# Patient Record
Sex: Male | Born: 1950 | Race: White | Hispanic: No | Marital: Married | Smoking: Former smoker
Health system: Southern US, Community
[De-identification: ages and names within clinical notes are randomized; demographics above are authoritative.]

## PROBLEM LIST (undated history)

## (undated) DIAGNOSIS — K219 Gastro-esophageal reflux disease without esophagitis: Secondary | ICD-10-CM

## (undated) DIAGNOSIS — M1990 Unspecified osteoarthritis, unspecified site: Secondary | ICD-10-CM

## (undated) DIAGNOSIS — I639 Cerebral infarction, unspecified: Secondary | ICD-10-CM

## (undated) DIAGNOSIS — I4891 Unspecified atrial fibrillation: Secondary | ICD-10-CM

## (undated) HISTORY — DX: Gastro-esophageal reflux disease without esophagitis: K21.9

## (undated) HISTORY — PX: ESOPHAGOGASTRODUODENOSCOPY: SHX1529

## (undated) HISTORY — PX: OTHER SURGICAL HISTORY: SHX169

## (undated) HISTORY — PX: COLONOSCOPY: SHX174

---

## 2015-08-17 ENCOUNTER — Encounter: Payer: Self-pay | Admitting: Internal Medicine

## 2015-09-03 ENCOUNTER — Ambulatory Visit (INDEPENDENT_AMBULATORY_CARE_PROVIDER_SITE_OTHER): Payer: BLUE CROSS/BLUE SHIELD | Admitting: Gastroenterology

## 2015-09-03 ENCOUNTER — Encounter (INDEPENDENT_AMBULATORY_CARE_PROVIDER_SITE_OTHER): Payer: Self-pay

## 2015-09-03 ENCOUNTER — Encounter: Payer: Self-pay | Admitting: Gastroenterology

## 2015-09-03 VITALS — BP 157/61 | HR 49 | Temp 97.8°F | Ht 74.0 in | Wt 293.2 lb

## 2015-09-03 DIAGNOSIS — D649 Anemia, unspecified: Secondary | ICD-10-CM | POA: Diagnosis not present

## 2015-09-03 DIAGNOSIS — K2270 Barrett's esophagus without dysplasia: Secondary | ICD-10-CM | POA: Insufficient documentation

## 2015-09-03 DIAGNOSIS — K219 Gastro-esophageal reflux disease without esophagitis: Secondary | ICD-10-CM

## 2015-09-03 DIAGNOSIS — R195 Other fecal abnormalities: Secondary | ICD-10-CM

## 2015-09-03 NOTE — Progress Notes (Signed)
Primary Care Physician:  Maximiano Coss, MD  Primary Gastroenterologist:  Roetta Sessions, MD   Chief Complaint  Patient presents with  . heme + stool    HPI:  Miguel Alvarez is a 65 y.o. male here at the request of Dr. hunger length for further evaluation of Hemoccult-positive stool, abnormal labs. Labs on 07/26/2015 showed hemoglobin of 12.3, hematocrit 37.5, MCV 88.7, platelets 192,000, white blood cell count 7000. He was heme positive 3. Patient denies melena or bright red blood per rectum. He denies constipation, diarrhea, abdominal pain, dysphagia, unintentional weight loss. He has chronic GERD more than 5 years duration. His wife states he has a history of Barrett's esophagus. It is been multiple years since his last endoscopy. He believes his last colonoscopy was a proximally 7 years ago by Dr. Samuella Cota. He is not interested and following up with Dr. Samuella Cota. He has taken ibuprofen and Aleve intermittently for foot pain. Recently stopped these and started diclofenac. He is on chronic Prilosec for chronic GERD.  Patient states that he was not completely asleep during his endoscopy before. According to patient was a long wait between receiving the sedation and start of this procedure. We have requested records for review. Patient prefers conscious sedation if possible. Patient does have a history of consuming beer a couple nights per week, 6-8 beers per night. Reports prior heavier use in the remote past.   Current Outpatient Prescriptions  Medication Sig Dispense Refill  . diclofenac (VOLTAREN) 50 MG EC tablet   0  . omeprazole (PRILOSEC) 40 MG capsule   1  . terbinafine (LAMISIL) 250 MG tablet   0   No current facility-administered medications for this visit.    Allergies as of 09/03/2015  . (No Known Allergies)    Past Medical History  Diagnosis Date  . GERD (gastroesophageal reflux disease)     Past Surgical History  Procedure Laterality Date  . Colonoscopy  pandya   6-7 years ago, some polyps  . Esophagogastroduodenoscopy      Barrett's esophagus per patient    Family History  Problem Relation Age of Onset  . Colon cancer Neg Hx     Social History   Social History  . Marital Status: Single    Spouse Name: N/A  . Number of Children: 2  . 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 Smoking status: Former Smoker    Quit date: 09/03/2007  . Smokeless tobacco: Not on file  . Alcohol Use: 0.0 oz/week    0 Standard drinks or equivalent per week     Comment: beer, two nights a week (6-8 beers per night).  used to drink more  . Drug Use: No  . Sexual Activity: Not on file   Other Topics Concern  . Not on file   Social History Narrative  . No narrative on file      ROS:  General: Negative for anorexia, weight loss, fever, chills, fatigue, weakness. Eyes: Negative for vision changes.  ENT: Negative for hoarseness, difficulty swallowing , nasal congestion. CV: Negative for chest pain, angina, palpitations, dyspnea on exertion, peripheral edema.  Respiratory: Negative for dyspnea at rest, dyspnea on exertion, cough, sputum, wheezing.  GI: See history of present illness. GU:  Negative for dysuria, hematuria, urinary incontinence, urinary frequency, nocturnal urination.  MS: Negative for joint pain, low back pain.  Derm: Negative for rash or itching.  Neuro:  Negative for weakness, abnormal sensation, seizure, frequent headaches, memory loss, confusion.  Psych: Negative for anxiety, depression, suicidal ideation, hallucinations.  Endo: Negative for unusual weight change.  Heme: Negative for bruising or bleeding. Allergy: Negative for rash or hives.    Physical Examination:  BP 157/61 mmHg  Pulse 49  Temp(Src) 97.8 F (36.6 C)  Ht 6\' 2"  (1.88 m)  Wt 293 lb 3.2 oz (132.995 kg)  BMI 37.63 kg/m2   General: Well-nourished, well-developed in no acute distress.  Head:  Normocephalic, atraumatic.   Eyes: Conjunctiva pink, no icterus. Mouth: Oropharyngeal mucosa moist and pink , no lesions erythema or exudate. Neck: Supple without thyromegaly, masses, or lymphadenopathy.  Lungs: Clear to auscultation bilaterally.  Heart: Regular rate and rhythm, no murmurs rubs or gallops.  Abdomen: Bowel sounds are normal, nontender, nondistended, no hepatosplenomegaly or masses, no abdominal bruits or    hernia , no rebound or guarding.   Rectal: Deferred Extremities: No lower extremity edema. No clubbing or deformities.  Neuro: Alert and oriented x 4 , grossly normal neurologically.  Skin: Warm and dry, no rash or jaundice.   Psych: Alert and cooperative, normal mood and affect.  Labs: As above. In addition on 07/26/2015, hemoglobin A1c 6.1, total bilirubin 0.4, alkaline phosphatase 60, AST 14, ALT 24, albumin 3.9, glucose 119, sodium 138, potassium 4.1.  Imaging Studies: No results found.

## 2015-09-03 NOTE — Assessment & Plan Note (Signed)
65 year old gentleman with history of mild normocytic anemia, Hemoccult-positive stool who presents for further evaluation. Patient has a history of chronic GERD on PPI therapy for years. He believes he has a history of Barrett's esophagus. If so, he is overdue for surveillance EGD. He gives a history of colon polyps, last colonoscopy 7 years ago. Clinically no GI symptoms. He has been taking NSAIDs for chronic foot pain. He may have chronic occult GI bleeding related to NSAIDs, malignancy needs to be excluded.  Patient has concerns about previous sedation. We have requested records for review. If at all possible he would like to undergo conscious sedation.  Once records have been reviewed, we will schedule patient for colonoscopy/EGD with Dr. Jena Gaussourk.  I have discussed the risks, alternatives, benefits with regards to but not limited to the risk of reaction to medication, bleeding, infection, perforation and the patient is agreeable to proceed. Written consent to be obtained.

## 2015-09-03 NOTE — Patient Instructions (Signed)
1. We will request your records for review. Once reviewed, we will schedule you for an upper endoscopy and colonoscopy.

## 2015-09-06 NOTE — Progress Notes (Signed)
cc'd to pcp 

## 2015-09-15 ENCOUNTER — Telehealth: Payer: Self-pay

## 2015-09-15 NOTE — Telephone Encounter (Signed)
Pt called wanting to get set up for his procedures. Pt was seen on 03/17 by LSL. Upon chart review it appears that LSL is to review pt charts from previous doctor prior to scheduling.   Pt states he is leaving town 03/30 and will not return until 04/23.  Pt 30 days is up on 04/16

## 2015-09-15 NOTE — Telephone Encounter (Signed)
I have sent another request for records to Dr Hendricks MiloPandya's office

## 2015-09-15 NOTE — Telephone Encounter (Signed)
I still have not received records from Dr. Pandya. DaSamuella Cotarl PikesSusan can we request any prior EGD/TCS and path, last OV note.  Candy, let the patient know what the hold up is. We will work this out. I will talk with rmr about the 30 day timeframe.

## 2015-09-15 NOTE — Telephone Encounter (Signed)
Pt is aware that we don't have records yet

## 2015-09-16 NOTE — Progress Notes (Signed)
Reviewed records received from Dr. Sarajane MarekPangia EGD July 2007 showed hiatal hernia, gastritis. Biopsies taken from the distal esophagus, consistent with Barrett's esophagus. We requested a colonoscopy twice but have not received those records.  There was no indication of how much or what type of medication he did receive during his EGD.  Based on inadequate sedation last time even if there was a delay between procedures, frequent alcohol use use, would recommend procedures to be done with deep sedation in the OR  Please schedule for colonoscopy/EGD with Dr. Jena Gaussourk in the OR.  I have discussed the risks, alternatives, benefits with regards to but not limited to the risk of reaction to medication, bleeding, infection, perforation and the patient is agreeable to proceed. Written consent to be obtained.  Please see telephone note from 09/15/15 regarding patient being out of town. I would suggest contacting him and getting him on first available date in the OR after patient is back. I will discuss with Dr. Jena Gaussourk.

## 2015-09-16 NOTE — Telephone Encounter (Signed)
See ov note addendum. 

## 2015-09-16 NOTE — Progress Notes (Signed)
Pt is out of will make contact when he returns

## 2015-09-17 NOTE — Progress Notes (Signed)
FYI. Dr. Jena Gaussourk said it is ok to go beyond 30 days in this circumstance but we need to get it done as close to 30 days as we can.

## 2015-09-22 ENCOUNTER — Encounter: Payer: Self-pay | Admitting: Internal Medicine

## 2015-10-18 ENCOUNTER — Other Ambulatory Visit: Payer: Self-pay

## 2015-10-18 NOTE — Progress Notes (Signed)
Called and spoke with pt wife. Told her we had an opening for this Thursday. She states she would tell husband/pt to call office back to arrange.

## 2015-10-18 NOTE — Progress Notes (Signed)
Pt called back and he is not able to do this Thursday. I told him that he would have to come back into the office since he would be pass his 30 days.

## 2015-10-19 NOTE — Progress Notes (Signed)
Please make him an appointment to come in to update H&P for TCS.

## 2015-10-20 ENCOUNTER — Encounter: Payer: Self-pay | Admitting: Internal Medicine

## 2015-10-20 ENCOUNTER — Telehealth: Payer: Self-pay | Admitting: Internal Medicine

## 2015-10-20 NOTE — Telephone Encounter (Signed)
Pt called to ask why he had an OV scheduled to come in to schedule his colonoscopy. He was already seen in the office and never was scheduled for the procedure. I told him if it goes beyond 30 days from the office visit we would need to bring him back in for an OV. He doesn't agree. He said that he is going out of town May 19-30. Please advise and call him at [phone]

## 2015-10-20 NOTE — Progress Notes (Signed)
APPT MADE AND LETTER SENT  °

## 2015-10-20 NOTE — Telephone Encounter (Signed)
Routing to LSL please see addendum notes.

## 2015-10-21 ENCOUNTER — Other Ambulatory Visit: Payer: Self-pay

## 2015-10-21 MED ORDER — PEG 3350-KCL-NA BICARB-NACL 420 G PO SOLR: 4000.0000 mL | Freq: Once | ORAL | Status: DC

## 2015-10-21 NOTE — Telephone Encounter (Signed)
Spoke with pt and he is set for procedure on 05/11 with RMR. Pt is coming by office to pick up instructions today

## 2015-10-21 NOTE — Telephone Encounter (Signed)
noted 

## 2015-10-21 NOTE — Telephone Encounter (Signed)
Please see office note from 3/17th Per RMR it's ok to schedule the patient pass his 30 days without and ov.  Candy, please call the patient and schedule him as soon as possible and I will cancel the ov.

## 2015-10-22 NOTE — Patient Instructions (Signed)
Miguel Alvarez  10/22/2015     @[email or web address] @   Your procedure is scheduled on 10/28/2015.  [facility].  Call this number if you have problems the morning of surgery:  [phone]   Remember:  Do not eat food or drink liquids after midnight.  Take these medicines the morning of surgery with A SIP OF WATER:   Prilosec   Do not wear jewelry, make-up or nail polish.  Do not wear lotions, powders, or perfumes.  You may wear deodorant.  Do not shave 48 hours prior to surgery.  Men may shave face and neck.  Do not bring valuables to the hospital.  [facility] not [facility] for any belongings or valuables.  Contacts, dentures or bridgework may not be worn into surgery.  Leave your suitcase in the car.  After surgery it may be brought to your room.  For patients admitted to the hospital, discharge time will be determined by your treatment team.  Patients discharged the day of surgery will not be allowed to drive home.   Name and phone number of your driver:   family Special instructions:  n/a  Please read over the following fact sheets that you were given. Care and Recovery After Surgery    Esophagogastroduodenoscopy Esophagogastroduodenoscopy (EGD) is a procedure that is used to examine the lining of the esophagus, stomach, and first part of the small intestine (duodenum). A long, flexible, lighted tube with a camera attached (endoscope) is inserted down the throat to view these organs. This procedure is done to detect problems or abnormalities, such as inflammation, bleeding, ulcers, or growths, in order to treat them. The procedure lasts 5-20 minutes. It is usually an outpatient procedure, but it may need to be performed in a hospital in emergency cases. LET [facility]ARE PROVIDER KNOW ABOUT:  Any allergies you have.  All medicines you are taking, including vitamins, herbs, eye drops, creams, and over-the-counter medicines.  Previous  problems you or members of your family have had with the use of anesthetics.  Any blood disorders you have.  Previous surgeries you have had.  Medical conditions you have. RISKS AND COMPLICATIONS Generally, this is a safe procedure. However, problems can occur and include:  Infection.  Bleeding.  Tearing (perforation) of the esophagus, stomach, or duodenum.  Difficulty breathing or not being able to breathe.  Excessive sweating.  Spasms of the larynx.  Slowed heartbeat.  Low blood pressure. BEFORE THE PROCEDURE  Do not eat or drink anything after midnight on the night before the procedure or as directed by your health care provider.  Do not take your regular medicines before the procedure if your health care provider asks you not to. Ask your health care provider about changing or stopping those medicines.  If you wear dentures, be prepared to remove them before the procedure.  Arrange for someone to drive you home after the procedure. PROCEDURE  A numbing medicine (local anesthetic) may be sprayed in your throat for comfort and to stop you from gagging or coughing.  You will have an IV tube inserted in a vein in your hand or arm. You will receive medicines and fluids through this tube.  You will be given a medicine to relax you (sedative).  A pain reliever will be given through the IV tube.  A mouth guard may be placed in your mouth to protect your teeth and to keep you from biting on the endoscope.  You will  be asked to lie on your left side.  The endoscope will be inserted down your throat and into your esophagus, stomach, and duodenum.  Air will be put through the endoscope to allow your health care provider to clearly view the lining of your esophagus.  The lining of your esophagus, stomach, and duodenum will be examined. During the exam, your health care provider may:  Remove tissue to be examined under a microscope (biopsy) for inflammation, infection, or  other medical problems.  Remove growths.  Remove objects (foreign bodies) that are stuck.  Treat any bleeding with medicines or other devices that stop tissues from bleeding (hot cautery, clipping devices).  Widen (dilate) or stretch narrowed areas of your esophagus and stomach.  The endoscope will be withdrawn. AFTER THE PROCEDURE  You will be taken to a recovery area for observation. Your blood pressure, heart rate, breathing rate, and blood oxygen level will be monitored often until the medicines you were given have worn off.  Do not eat or drink anything until the numbing medicine has worn off and your gag reflex has returned. You may choke.  Your health care provider should be able to discuss his or her findings with you. It will take longer to discuss the test results if any biopsies were taken.   This information is not intended to replace advice given to you by your health care provider. Make sure you discuss any questions you have with your health care provider.   Document Released: 10/06/2004 Document Revised: 06/26/2014 Document Reviewed: 05/08/2012 Elsevier Interactive Patient Education 2016 [employer]. Colonoscopy A colonoscopy is an exam to look at the entire large intestine (colon). This exam can help find problems such as tumors, polyps, inflammation, and areas of bleeding. The exam takes about 1 hour.  LET [facility] CARE PROVIDER KNOW ABOUT:   Any allergies you have.  All medicines you are taking, including vitamins, herbs, eye drops, creams, and over-the-counter medicines.  Previous problems you or members of your family have had with the use of anesthetics.  Any blood disorders you have.  Previous surgeries you have had.  Medical conditions you have. RISKS AND COMPLICATIONS  Generally, this is a safe procedure. However, as with any procedure, complications can occur. Possible complications include:  Bleeding.  Tearing or rupture of the colon  wall.  Reaction to medicines given during the exam.  Infection (rare). BEFORE THE PROCEDURE   Ask your health care provider about changing or stopping your regular medicines.  You may be prescribed an oral bowel prep. This involves drinking a large amount of medicated liquid, starting the day before your procedure. The liquid will cause you to have multiple loose stools until your stool is almost clear or light green. This cleans out your colon in preparation for the procedure.  Do not eat or drink anything else once you have started the bowel prep, unless your health care provider tells you it is safe to do so.  Arrange for someone to drive you home after the procedure. PROCEDURE   You will be given medicine to help you relax (sedative).  You will lie on your side with your knees bent.  A long, flexible tube with a light and camera on the end (colonoscope) will be inserted through the rectum and into the colon. The camera sends video back to a computer screen as it moves through the colon. The colonoscope also releases carbon dioxide gas to inflate the colon. This helps your health care provider  see the area better.  During the exam, your health care provider may take a small tissue sample (biopsy) to be examined under a microscope if any abnormalities are found.  The exam is finished when the entire colon has been viewed. AFTER THE PROCEDURE   Do not drive for 24 hours after the exam.  You may have a small amount of blood in your stool.  You may pass moderate amounts of gas and have mild abdominal cramping or bloating. This is caused by the gas used to inflate your colon during the exam.  Ask when your test results will be ready and how you will get your results. Make sure you get your test results.   This information is not intended to replace advice given to you by your health care provider. Make sure you discuss any questions you have with your health care provider.    Document Released: 06/02/2000 Document Revised: 03/26/2013 Document Reviewed: 02/10/2013 Elsevier Interactive Patient Education [employer]

## 2015-10-25 ENCOUNTER — Encounter (HOSPITAL_COMMUNITY)
Admission: RE | Admit: 2015-10-25 | Discharge: 2015-10-25 | Disposition: A | Discharge status: Home / Self Care | Payer: BLUE CROSS/BLUE SHIELD | Source: Ambulatory Visit | Attending: Internal Medicine | Admitting: Internal Medicine

## 2015-10-25 ENCOUNTER — Encounter (HOSPITAL_COMMUNITY): Payer: Self-pay

## 2015-10-25 ENCOUNTER — Other Ambulatory Visit: Payer: Self-pay

## 2015-10-25 DIAGNOSIS — K640 First degree hemorrhoids: Secondary | ICD-10-CM | POA: Diagnosis not present

## 2015-10-25 DIAGNOSIS — Z8719 Personal history of other diseases of the digestive system: Secondary | ICD-10-CM | POA: Diagnosis not present

## 2015-10-25 DIAGNOSIS — M1991 Primary osteoarthritis, unspecified site: Secondary | ICD-10-CM | POA: Diagnosis not present

## 2015-10-25 DIAGNOSIS — I10 Essential (primary) hypertension: Secondary | ICD-10-CM | POA: Diagnosis not present

## 2015-10-25 DIAGNOSIS — K5730 Diverticulosis of large intestine without perforation or abscess without bleeding: Secondary | ICD-10-CM | POA: Diagnosis not present

## 2015-10-25 DIAGNOSIS — Z09 Encounter for follow-up examination after completed treatment for conditions other than malignant neoplasm: Secondary | ICD-10-CM | POA: Diagnosis not present

## 2015-10-25 DIAGNOSIS — R195 Other fecal abnormalities: Secondary | ICD-10-CM | POA: Diagnosis not present

## 2015-10-25 DIAGNOSIS — Z01810 Encounter for preprocedural cardiovascular examination: Secondary | ICD-10-CM | POA: Diagnosis not present

## 2015-10-25 DIAGNOSIS — Z87891 Personal history of nicotine dependence: Secondary | ICD-10-CM | POA: Diagnosis not present

## 2015-10-25 DIAGNOSIS — Z01812 Encounter for preprocedural laboratory examination: Secondary | ICD-10-CM | POA: Diagnosis not present

## 2015-10-25 DIAGNOSIS — K449 Diaphragmatic hernia without obstruction or gangrene: Secondary | ICD-10-CM | POA: Diagnosis not present

## 2015-10-25 DIAGNOSIS — K219 Gastro-esophageal reflux disease without esophagitis: Secondary | ICD-10-CM | POA: Diagnosis not present

## 2015-10-25 HISTORY — DX: Unspecified osteoarthritis, unspecified site: M19.90

## 2015-10-25 LAB — BASIC METABOLIC PANEL
Anion gap: 9 (ref 5–15)
BUN: 24 mg/dL — AB (ref 6–20)
CALCIUM: 9.4 mg/dL (ref 8.9–10.3)
CO2: 22 mmol/L (ref 22–32)
CREATININE: 1 mg/dL (ref 0.61–1.24)
Chloride: 106 mmol/L (ref 101–111)
GFR calc Af Amer: >60 mL/min (ref >60)
Glucose, Bld: 138 mg/dL — ABNORMAL HIGH (ref 65–99)
Potassium: 4.1 mmol/L (ref 3.5–5.1)
SODIUM: 137 mmol/L (ref 135–145)

## 2015-10-25 LAB — CBC WITH DIFFERENTIAL/PLATELET
Basophils Absolute: 0 10*3/uL (ref 0.0–0.1)
Basophils Relative: 0 %
Eosinophils Absolute: 0.1 10*3/uL (ref 0.0–0.7)
Eosinophils Relative: 2 %
HCT: 39.6 % (ref 39.0–52.0)
Hemoglobin: 13.3 g/dL (ref 13.0–17.0)
Lymphocytes Relative: 23 %
Lymphs Abs: 1.8 10*3/uL (ref 0.7–4.0)
MCH: 30.2 pg (ref 26.0–34.0)
MCHC: 33.6 g/dL (ref 30.0–36.0)
MCV: 89.8 fL (ref 78.0–100.0)
Monocytes Absolute: 0.4 10*3/uL (ref 0.1–1.0)
Monocytes Relative: 5 %
Neutro Abs: 5.3 10*3/uL (ref 1.7–7.7)
Neutrophils Relative %: 70 %
Platelets: 198 10*3/uL (ref 150–400)
RBC: 4.41 MIL/uL (ref 4.22–5.81)
RDW: 12.9 % (ref 11.5–15.5)
WBC: 7.6 10*3/uL (ref 4.0–10.5)

## 2015-10-25 NOTE — Progress Notes (Signed)
   10/25/15 1417  OBSTRUCTIVE SLEEP APNEA  Have you ever been diagnosed with sleep apnea through a sleep study? No  Do you snore loudly (loud enough to be heard through closed doors)?  1  Do you often feel tired, fatigued, or sleepy during the daytime (such as falling asleep during driving or talking to someone)? 1  Has anyone observed you stop breathing during your sleep? 0  Do you have, or are you being treated for high blood pressure? 0  BMI more than 35 kg/m2? 1  Age > 50 (1-yes) 1  Neck circumference greater than:Male 16 inches or larger, Male 17inches or larger? 1  Male Gender (Yes=1) 1  Obstructive Sleep Apnea Score 6  Score 5 or greater  Results sent to PCP

## 2015-10-28 ENCOUNTER — Encounter (HOSPITAL_COMMUNITY)
Admission: RE | Disposition: A | Discharge status: Home / Self Care | Payer: Self-pay | Source: Ambulatory Visit | Attending: Internal Medicine

## 2015-10-28 ENCOUNTER — Encounter (HOSPITAL_COMMUNITY): Payer: Self-pay | Admitting: *Deleted

## 2015-10-28 ENCOUNTER — Ambulatory Visit (HOSPITAL_COMMUNITY)
Admission: RE | Admit: 2015-10-28 | Discharge: 2015-10-28 | Disposition: A | Discharge status: Home / Self Care | Payer: BLUE CROSS/BLUE SHIELD | Source: Ambulatory Visit | Attending: Internal Medicine | Admitting: Internal Medicine

## 2015-10-28 ENCOUNTER — Ambulatory Visit (HOSPITAL_COMMUNITY): Payer: BLUE CROSS/BLUE SHIELD | Admitting: Anesthesiology

## 2015-10-28 DIAGNOSIS — R195 Other fecal abnormalities: Secondary | ICD-10-CM | POA: Insufficient documentation

## 2015-10-28 DIAGNOSIS — K640 First degree hemorrhoids: Secondary | ICD-10-CM | POA: Insufficient documentation

## 2015-10-28 DIAGNOSIS — M1991 Primary osteoarthritis, unspecified site: Secondary | ICD-10-CM | POA: Insufficient documentation

## 2015-10-28 DIAGNOSIS — K2270 Barrett's esophagus without dysplasia: Secondary | ICD-10-CM

## 2015-10-28 DIAGNOSIS — Z8719 Personal history of other diseases of the digestive system: Secondary | ICD-10-CM | POA: Insufficient documentation

## 2015-10-28 DIAGNOSIS — K5730 Diverticulosis of large intestine without perforation or abscess without bleeding: Secondary | ICD-10-CM | POA: Diagnosis not present

## 2015-10-28 DIAGNOSIS — K449 Diaphragmatic hernia without obstruction or gangrene: Secondary | ICD-10-CM | POA: Diagnosis not present

## 2015-10-28 DIAGNOSIS — R229 Localized swelling, mass and lump, unspecified: Secondary | ICD-10-CM | POA: Diagnosis not present

## 2015-10-28 DIAGNOSIS — Z01812 Encounter for preprocedural laboratory examination: Secondary | ICD-10-CM | POA: Insufficient documentation

## 2015-10-28 DIAGNOSIS — K2289 Other specified disease of esophagus: Secondary | ICD-10-CM | POA: Insufficient documentation

## 2015-10-28 DIAGNOSIS — I10 Essential (primary) hypertension: Secondary | ICD-10-CM | POA: Insufficient documentation

## 2015-10-28 DIAGNOSIS — Z87891 Personal history of nicotine dependence: Secondary | ICD-10-CM | POA: Insufficient documentation

## 2015-10-28 DIAGNOSIS — K219 Gastro-esophageal reflux disease without esophagitis: Secondary | ICD-10-CM | POA: Insufficient documentation

## 2015-10-28 DIAGNOSIS — R12 Heartburn: Secondary | ICD-10-CM | POA: Diagnosis not present

## 2015-10-28 DIAGNOSIS — Z09 Encounter for follow-up examination after completed treatment for conditions other than malignant neoplasm: Secondary | ICD-10-CM | POA: Insufficient documentation

## 2015-10-28 DIAGNOSIS — Z01810 Encounter for preprocedural cardiovascular examination: Secondary | ICD-10-CM | POA: Insufficient documentation

## 2015-10-28 DIAGNOSIS — K229 Disease of esophagus, unspecified: Secondary | ICD-10-CM | POA: Insufficient documentation

## 2015-10-28 DIAGNOSIS — K921 Melena: Secondary | ICD-10-CM | POA: Insufficient documentation

## 2015-10-28 HISTORY — PX: COLONOSCOPY WITH PROPOFOL: SHX5780

## 2015-10-28 HISTORY — PX: ESOPHAGOGASTRODUODENOSCOPY (EGD) WITH PROPOFOL: SHX5813

## 2015-10-28 SURGERY — COLONOSCOPY WITH PROPOFOL
Anesthesia: Monitor Anesthesia Care

## 2015-10-28 MED ORDER — PROPOFOL 10 MG/ML IV BOLUS
INTRAVENOUS | Status: AC
  Filled 2015-10-28: qty 40

## 2015-10-28 MED ORDER — LIDOCAINE HCL (PF) 1 % IJ SOLN
INTRAMUSCULAR | Status: AC
  Filled 2015-10-28: qty 5

## 2015-10-28 MED ORDER — PROPOFOL 10 MG/ML IV BOLUS
INTRAVENOUS | Status: AC
  Filled 2015-10-28: qty 20

## 2015-10-28 MED ORDER — FENTANYL CITRATE (PF) 100 MCG/2ML IJ SOLN
INTRAMUSCULAR | Status: AC
  Filled 2015-10-28: qty 2

## 2015-10-28 MED ORDER — MIDAZOLAM HCL 2 MG/2ML IJ SOLN
1.0000 mg | INTRAMUSCULAR | Status: DC | PRN
  Administered 2015-10-28: 2 mg via INTRAVENOUS

## 2015-10-28 MED ORDER — ONDANSETRON HCL 4 MG/2ML IJ SOLN
INTRAMUSCULAR | Status: AC
  Filled 2015-10-28: qty 2

## 2015-10-28 MED ORDER — LIDOCAINE HCL (CARDIAC) 10 MG/ML IV SOLN
INTRAVENOUS | Status: DC | PRN
  Administered 2015-10-28: 50 mg via INTRAVENOUS

## 2015-10-28 MED ORDER — PROPOFOL 10 MG/ML IV BOLUS
INTRAVENOUS | Status: DC | PRN
  Administered 2015-10-28 (×4): 13.6 mg via INTRAVENOUS

## 2015-10-28 MED ORDER — LIDOCAINE VISCOUS 2 % MT SOLN
OROMUCOSAL | Status: AC
  Filled 2015-10-28: qty 15

## 2015-10-28 MED ORDER — GLYCOPYRROLATE 0.2 MG/ML IJ SOLN
INTRAMUSCULAR | Status: AC
  Filled 2015-10-28: qty 1

## 2015-10-28 MED ORDER — MIDAZOLAM HCL 2 MG/2ML IJ SOLN
INTRAMUSCULAR | Status: AC
  Filled 2015-10-28: qty 2

## 2015-10-28 MED ORDER — LIDOCAINE VISCOUS 2 % MT SOLN
5.0000 mL | Freq: Two times a day (BID) | OROMUCOSAL | Status: DC
  Administered 2015-10-28 (×2): 5 mL via OROMUCOSAL

## 2015-10-28 MED ORDER — LACTATED RINGERS IV SOLN
INTRAVENOUS | Status: DC
  Administered 2015-10-28: 1000 mL via INTRAVENOUS

## 2015-10-28 MED ORDER — ONDANSETRON HCL 4 MG/2ML IJ SOLN
4.0000 mg | Freq: Once | INTRAMUSCULAR | Status: AC
  Administered 2015-10-28: 4 mg via INTRAVENOUS

## 2015-10-28 MED ORDER — GLYCOPYRROLATE 0.2 MG/ML IJ SOLN
0.2000 mg | Freq: Once | INTRAMUSCULAR | Status: AC
  Administered 2015-10-28: 0.2 mg via INTRAVENOUS

## 2015-10-28 MED ORDER — PROPOFOL 500 MG/50ML IV EMUL
INTRAVENOUS | Status: DC | PRN
  Administered 2015-10-28: 50 ug/kg/min via INTRAVENOUS

## 2015-10-28 MED ORDER — FENTANYL CITRATE (PF) 100 MCG/2ML IJ SOLN
25.0000 ug | INTRAMUSCULAR | Status: AC
  Administered 2015-10-28 (×2): 25 ug via INTRAVENOUS

## 2015-10-28 NOTE — H&P (Signed)
@  [email or web address]@   Primary Care Physician:  Maximiano CossHUNGARLAND,JOHN DAVID, MD Primary Gastroenterologist:  Dr. Jena Gaussourk  Pre-Procedure History & Physical: HPI:  Miguel Alvarez is a 65 y.o. male here for   Past Medical History  Diagnosis Date  . GERD (gastroesophageal reflux disease)   . Arthritis     osteoarthritis    Past Surgical History  Procedure Laterality Date  . Colonoscopy  pandya    6-7 years ago, some polyps  . Esophagogastroduodenoscopy      Barrett's esophagus per patient  . Removal of bone foot Left     age 65    Prior to Admission medications   Medication Sig Start Date End Date Taking? Authorizing Provider  diclofenac (VOLTAREN) 50 MG EC tablet Take 50 mg by mouth daily.  07/28/15  Yes Historical Provider, MD  omeprazole (PRILOSEC) 40 MG capsule Take 40 mg by mouth daily.  06/19/15  Yes Historical Provider, MD  polyethylene glycol-electrolytes (NULYTELY/GOLYTELY) 420 g solution Take 4,000 mLs by mouth once. 10/21/15  Yes Tiffany KocherLeslie S Lewis, PA-C    Allergies as of 10/21/2015  . (No Known Allergies)    Family History  Problem Relation Age of Onset  . Colon cancer Neg Hx     Social History   Social History  . Marital Status: Married    Spouse Name: N/A  . Number of Children: 2  . 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 Smoking status: Former Smoker -- 1.00 packs/day for 40 years    Types: Cigarettes    Quit date: 09/03/2007  . Smokeless tobacco: Never Used  . Alcohol Use: 7.2 oz/week    0 Standard drinks or equivalent, 12 Cans of beer per week     Comment: beer, two nights a week (6-8 beers per night).  used to drink more  . Drug Use: No  . Sexual Activity: Not on file   Other Topics Concern  . Not on file   Social History Narrative    Review of Systems: See HPI, otherwise negative ROS  Physical Exam: BP 178/62 mmHg  Pulse 46  Temp(Src) 97.6 F (36.4 C) (Oral)  Resp 18  Ht 6\' 2"  (1.88  m)  Wt 301 lb (136.533 kg)  BMI 38.63 kg/m2  SpO2 96% General:   Alert,  Well-developed, well-nourished, pleasant and cooperative in NAD Skin:  Intact without significant lesions or rashes. Eyes:  Sclera clear, no icterus.   Conjunctiva pink. Ears:  Normal auditory acuity. Nose:  No deformity, discharge,  or lesions. Mouth:  No deformity or lesions. Neck:  Supple; no masses or thyromegaly. No significant cervical adenopathy. Lungs:  Clear throughout to auscultation.   No wheezes, crackles, or rhonchi. No acute distress. Heart:  Regular rate and rhythm; no murmurs, clicks, rubs,  or gallops. Abdomen: Non-distended, normal bowel sounds.  Soft and nontender without appreciable mass or hepatosplenomegaly.  Pulses:  Normal pulses noted. Extremities:  Without clubbing or edema.  Impression/     Notice: This dictation was prepared with Dragon dictation along with smaller phrase technology. Any transcriptional errors that result from this process are unintentional and may not be corrected upon review.

## 2015-10-28 NOTE — Addendum Note (Signed)
Addendum  created 10/28/15 1158 by Franco Noneseresa S Rennee Coyne, CRNA   Modules edited: Anesthesia Flowsheet

## 2015-10-28 NOTE — Discharge Instructions (Signed)
°  Colonoscopy Discharge Instructions  Read the instructions outlined below and refer to this sheet in the next few weeks. These discharge instructions provide you with general information on caring for yourself after you leave the hospital. Your doctor may also give you specific instructions. While your treatment has been planned according to the most current medical practices available, unavoidable complications occasionally occur. If you have any problems or questions after discharge, call Dr. Jena Gaussourk at [phone]. ACTIVITY  You may resume your regular activity, but move at a slower pace for the next 24 hours.   Take frequent rest periods for the next 24 hours.   Walking will help get rid of the air and reduce the bloated feeling in your belly (abdomen).   No driving for 24 hours (because of the medicine (anesthesia) used during the test).    Do not sign any important legal documents or operate any machinery for 24 hours (because of the anesthesia used during the test).  NUTRITION  Drink plenty of fluids.   You may resume your normal diet as instructed by your doctor.   Begin with a light meal and progress to your normal diet. Heavy or fried foods are harder to digest and may make you feel sick to your stomach (nauseated).   Avoid alcoholic beverages for 24 hours or as instructed.  MEDICATIONS  You may resume your normal medications unless your doctor tells you otherwise.  WHAT YOU CAN EXPECT TODAY  Some feelings of bloating in the abdomen.   Passage of more gas than usual.   Spotting of blood in your stool or on the toilet paper.  IF YOU HAD POLYPS REMOVED DURING THE COLONOSCOPY:  No aspirin products for 7 days or as instructed.   No alcohol for 7 days or as instructed.   Eat a soft diet for the next 24 hours.  FINDING OUT THE RESULTS OF YOUR TEST Not all test results are available during your visit. If your test results are not back during the visit, make an appointment  with your caregiver to find out the results. Do not assume everything is normal if you have not heard from your caregiver or the medical facility. It is important for you to follow up on all of your test results.  SEEK IMMEDIATE MEDICAL ATTENTION IF:  You have more than a spotting of blood in your stool.   Your belly is swollen (abdominal distention).   You are nauseated or vomiting.   You have a temperature over 101.   You have abdominal pain or discomfort that is severe or gets worse throughout the day.   Hemorrhoid and diverticulosis information provided  Repeat colonoscopy in 5 years  CBC today  GERD Information provided  Further recommendations to follow pending review of pathology report

## 2015-10-28 NOTE — Anesthesia Preprocedure Evaluation (Signed)
Anesthesia Evaluation  Patient identified by MRN, date of birth, ID band Patient awake    Reviewed: Allergy & Precautions, NPO status , Patient's Chart, lab work & pertinent test results  History of Anesthesia Complications (+) history of anesthetic complications (failed conscious sedation for end[place] in [place])  Airway Mallampati: III  TM Distance: >3 FB     Dental  (+) Loose, Chipped, Dental Advisory Given,    Pulmonary former smoker,    breath sounds clear to auscultation       Cardiovascular hypertension (borderline. no meds),  Rhythm:Regular Rate:Normal     Neuro/Psych    GI/Hepatic GERD  Medicated and Controlled,  Endo/Other    Renal/GU      Musculoskeletal   Abdominal   Peds  Hematology   Anesthesia Other Findings   Reproductive/Obstetrics                             Anesthesia Physical Anesthesia Plan  ASA: II  Anesthesia Plan: MAC   Post-op Pain Management:    Induction: Intravenous  Airway Management Planned: Simple Face Mask  Additional Equipment:   Intra-op Plan:   Post-operative Plan:   Informed Consent: I have reviewed the patients History and Physical, chart, labs and discussed the procedure including the risks, benefits and alternatives for the proposed anesthesia with the patient or authorized representative who has indicated his/her understanding and acceptance.     Plan Discussed with:   Anesthesia Plan Comments:         Anesthesia Quick Evaluation

## 2015-10-28 NOTE — Transfer of Care (Signed)
Immediate Anesthesia Transfer of Care Note  Patient: Miguel Alvarez  Procedure(s) Performed: Procedure(s) with comments: COLONOSCOPY WITH PROPOFOL (N/A) - 0730 ESOPHAGOGASTRODUODENOSCOPY (EGD) WITH PROPOFOL (N/A)  Patient Location: PACU  Anesthesia Type:MAC  Level of Consciousness: awake and patient cooperative  Airway & Oxygen Therapy: Patient Spontanous Breathing and non-rebreather face mask  Post-op Assessment: Report given to RN, Post -op Vital signs reviewed and stable and Patient moving all extremities  Post vital signs: Reviewed and stable    Last Pain: There were no vitals filed for this visit.    Patients Stated Pain Goal: 6 (10/28/15 0630)  Complications: No apparent anesthesia complications

## 2015-10-28 NOTE — H&P (Signed)
 @   Primary Care Physician:  Miguel Coss, MD Primary Gastroenterologist:  Dr. Jena Gauss  Pre-Procedure History & Physical: HPI:  Miguel Alvarez is a 65 y.o. male here for here for evaluation of Barrett's esophagus and Hemoccult-positive stool. Distant history of colonoscopy and polyps.-At least 7 years ago. History of Barrett's esophagus with no recent surveillance. Omeprazole 40 mg daily control symptoms well. No dysphagia. No hematochezia and no melena.  Past Medical History  Diagnosis Date  . GERD (gastroesophageal reflux disease)   . Arthritis     osteoarthritis    Past Surgical History  Procedure Laterality Date  . Colonoscopy  pandya    6-7 years ago, some polyps  . Esophagogastroduodenoscopy      Barrett's esophagus per patient  . Removal of bone foot Left     age 20    Prior to Admission medications   Medication Sig Start Date End Date Taking? Authorizing Provider  diclofenac (VOLTAREN) 50 MG EC tablet Take 50 mg by mouth daily.  07/28/15  Yes Historical Provider, MD  omeprazole (PRILOSEC) 40 MG capsule Take 40 mg by mouth daily.  06/19/15  Yes Historical Provider, MD  polyethylene glycol-electrolytes (NULYTELY/GOLYTELY) 420 g solution Take 4,000 mLs by mouth once. 10/21/15  Yes Tiffany Kocher, PA-C    Allergies as of 10/21/2015  . (No Known Allergies)    Family History  Problem Relation Age of Onset  . Colon cancer Neg Hx     Social History   Social History  . Marital Status: Married    Spouse Name: N/A  . Number of Children: 2  . 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 Smoking status: Former Smoker -- 1.00 packs/day for 40 years    Types: Cigarettes    Quit date: 09/03/2007  . Smokeless tobacco: Never Used  . Alcohol Use: 7.2 oz/week    0 Standard drinks or equivalent, 12 Cans of beer per week     Comment: beer, two nights a week (6-8 beers per night).  used to drink more   . Drug Use: No  . Sexual Activity: Not on file   Other Topics Concern  . Not on file   Social History Narrative    Review of Systems: See HPI, otherwise negative ROS  Physical Exam: BP 161/70 mmHg  Pulse 46  Temp(Src) 97.6 F (36.4 C) (Oral)  Resp 13  Ht  (1.88 m)  Wt 301 lb (136.533 kg)  BMI 38.63 kg/m2  SpO2 99% General:   Alert,  Well-developed, well-nourished, pleasant and cooperative in NAD Skin:  Intact without significant lesions or rashes. Neck:  Supple; no masses or thyromegaly. No significant cervical adenopathy. Lungs:  Clear throughout to auscultation.   No wheezes, crackles, or rhonchi. No acute distress. Heart:  Regular rate and rhythm; no murmurs, clicks, rubs,  or gallops. Abdomen: Non-distended, normal bowel sounds.  Soft and nontender without appreciable mass or hepatosplenomegaly.  Pulses:  Normal pulses noted. Extremities:  Without clubbing or edema.  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 History of colon polyps with distant colonoscopy. Hemoccult positive. No overt bleeding. Poor anesthesia experience at his last endoscopy session in [place]. Needs to have his upper GI tract evaluated once again a prickly view of the history of Barrett's esophagus. Also needs colonoscopy at this time. Hemoccult positive stool. History of colonic polyps.  Recommendations:   Diagnostic EGD and colonoscopy. Plan for biopsy of the esophagus in all likelihood. The risks, benefits, limitations, imponderables and alternatives regarding both EGD and colonoscopy have been reviewed with the patient. Questions have been answered. All parties agreeable.   Notice: This dictation was prepared with Dragon dictation along with smaller phrase technology. Any transcriptional errors that result from this process are unintentional and may not be corrected upon review.

## 2015-10-28 NOTE — Anesthesia Postprocedure Evaluation (Signed)
Anesthesia Post Note  Patient: Miguel Alvarez  Procedure(s) Performed: Procedure(s) (LRB): COLONOSCOPY WITH PROPOFOL (N/A) ESOPHAGOGASTRODUODENOSCOPY (EGD) WITH PROPOFOL (N/A)  Patient location during evaluation: Short Stay Anesthesia Type: MAC Level of consciousness: awake Pain management: satisfactory to patient Vital Signs Assessment: post-procedure vital signs reviewed and stable Respiratory status: spontaneous breathing Cardiovascular status: stable Anesthetic complications: no    Last Vitals:  Filed Vitals:   10/28/15 0839 10/28/15 0845  BP:  167/67  Pulse: 41 38  Temp:    Resp: 10 12    Last Pain:  Filed Vitals:   10/28/15 0847  PainSc: 0-No pain                 Cordaro Mukai

## 2015-10-28 NOTE — Op Note (Signed)
[facility] Patient Name: Miguel Alvarez Procedure Date: 10/28/2015 7:57 AM MRN: [id number] Date of Birth: 07/06/1950 Attending MD: Gennette Pacobert Michael Amori Colomb , MD CSN: [id number] Age: 65 Admit Type: Outpatient Procedure:                Ileo-Colonoscopy ?"diagnostic Indications:              Heme positive stool; history of colonic polyps Providers:                Gennette Pacobert Michael Lajean Boese, MD, Brain HiltsLurae Albert, RN, Burke Keelsrisann                            Tilley, Technician Referring MD:              Medicines:                Monitored Anesthesia Care Complications:            No immediate complications. Estimated Blood Loss:     Estimated blood loss: none. Procedure:                Pre-Anesthesia Assessment:                           - Prior to the procedure, a History and Physical                            was performed, and patient medications and                            allergies were reviewed. The patient's tolerance of                            previous anesthesia was also reviewed. The risks                            and benefits of the procedure and the sedation                            options and risks were discussed with the patient.                            All questions were answered, and informed consent                            was obtained. Prior Anticoagulants: The patient has                            taken no previous anticoagulant or antiplatelet                            agents. ASA Grade Assessment: II - A patient with                            mild systemic disease. After reviewing the risks  and benefits, the patient was deemed in                            satisfactory condition to undergo the procedure.                           After obtaining informed consent, the colonoscope                            was passed under direct vision. Throughout the                            procedure, the patient's blood pressure, pulse, and                          oxygen saturations were monitored continuously. The                            [id number] ([id number]) scope was introduced through                            the anus and advanced to the the cecum, identified                            by appendiceal orifice and ileocecal valve. The                            colonoscopy was performed without difficulty. The                            patient tolerated the procedure well. The quality                            of the bowel preparation was adequate. The terminal                            ileum, ileocecal valve, appendiceal orifice, and                            rectum were photographed. The entire colon was well                            visualized. Scope In: 8:01:19 AM Scope Out: 8:14:14 AM Scope Withdrawal Time: 0 hours 8 minutes 31 seconds  Total Procedure Duration: 0 hours 12 minutes 55 seconds  Findings:      The perianal and digital rectal examinations were normal.      Non-bleeding hemorrhoids were found during retroflexion. The hemorrhoids       were moderate and Grade I (internal hemorrhoids that do not prolapse).      Multiple medium-mouthed diverticula were found in the entire colon. The       remainder of the colonic mucosa. Normal.      The distal 5 cm of terminal ileum appeared normal. Impression:               -  Non-bleeding hemorrhoids.                           - Diverticulosis in the entire examined colon.                           - The examined portion of the ileum was normal.                           - No specimens collected. Moderate Sedation:      Moderate (conscious) sedation was personally administered by an       anesthesia professional. The following parameters were monitored: oxygen       saturation, heart rate, blood pressure, respiratory rate, EKG, adequacy       of pulmonary ventilation, and response to care. Total physician       intraservice time was 30 minutes. Recommendation:            - Patient has a contact number available for                            emergencies. The signs and symptoms of potential                            delayed complications were discussed with the                            patient. Return to normal activities tomorrow.                            Written discharge instructions were provided to the                            patient.                           - Resume previous diet.                           - Continue present medications.                           - Repeat colonoscopy in 5 years for surveillance.                           - Return to GI clinic after studies are complete.                            CBC today. See EGD report. Procedure Code(s):        --- Professional ---                           [id number], Colonoscopy, flexible; diagnostic, including                            collection of specimen(s) by brushing or washing,  when performed (separate procedure) Diagnosis Code(s):        --- Professional ---                           K64.0, First degree hemorrhoids                           R19.5, Other fecal abnormalities                           K57.30, Diverticulosis of large intestine without                            perforation or abscess without bleeding CPT copyright 2016 American Medical Association. All rights reserved. The codes documented in this report are preliminary and upon coder review may  be revised to meet current compliance requirements. Gerrit Friends. Tresten Pantoja, MD Gennette Pac, MD 10/28/2015 9:03:30 AM This report has been signed electronically. Number of Addenda: 0

## 2015-10-28 NOTE — Op Note (Signed)
[facility] Patient Name: Miguel Alvarez Procedure Date: 10/28/2015 7:19 AM MRN: [id number] Date of Birth: [birth year] Attending MD: Gennette Pac , MD CSN: [id number] Age: 65 Admit Type: Outpatient Procedure:                Upper GI endoscopy with esophageal biopsy Indications:              Heartburn, Follow-up of Barrett's esophagus;                            anemia/Hemoccult positive stool Providers:                Gennette Pac, MD, Brain Hilts, RN, Burke Keels, Technician Referring MD:              Medicines:                Monitored Anesthesia Care Complications:            No immediate complications. Estimated Blood Loss:     Estimated blood loss was minimal. Procedure:                Pre-Anesthesia Assessment:                           - Prior to the procedure, a History and Physical                            was performed, and patient medications and                            allergies were reviewed. The patient's tolerance of                            previous anesthesia was also reviewed. The risks                            and benefits of the procedure and the sedation                            options and risks were discussed with the patient.                            All questions were answered, and informed consent                            was obtained. Prior Anticoagulants: The patient has                            taken no previous anticoagulant or antiplatelet                            agents. ASA Grade Assessment: II - A patient with  mild systemic disease. After reviewing the risks                            and benefits, the patient was deemed in                            satisfactory condition to undergo the procedure.                           After obtaining informed consent, the endoscope was                            passed under direct vision. Throughout the                   procedure, the patient's blood pressure, pulse, and                            oxygen saturations were monitored continuously. The                            [id number] ([id number]) scope was introduced through the                            mouth, and advanced to the second part of duodenum.                            The upper GI endoscopy was accomplished without                            difficulty. The patient tolerated the procedure                            well. Scope In: 7:47:52 AM Scope Out: 7:53:41 AM Total Procedure Duration: 0 hours 5 minutes 49 seconds  Findings:      The examined esophagus was normal. patient had somewhat of an undulating       Z line. There is no esophagitis. No nodularity. Endoscopically, did not       appear to be Barrett's esophagus.      A small hiatal hernia was present.      The exam was otherwise without abnormality.      The second portion of the duodenum was normal. The distal esophagus just       proximal to the Z line was biopsied a cold forceps for histology.       Estimated blood loss was minimal. Impression:               - Normal esophagus(undulating Z line). status post                            esophageal biopsy                           - Small hiatal hernia.                           -  The examination was otherwise normal. Moderate Sedation:      Moderate (conscious) sedation was personally administered by an       anesthesia professional. The following parameters were monitored: oxygen       saturation, heart rate, blood pressure, respiratory rate, EKG, adequacy       of pulmonary ventilation, and response to care. Total physician       intraservice time was 30 minutes. Recommendation:           - Patient has a contact number available for                            emergencies. The signs and symptoms of potential                            delayed complications were discussed with the                             patient. Return to normal activities tomorrow.                            Written discharge instructions were provided to the                            patient.                           - Advance diet as tolerated.                           - Continue present medications. Continue omeprazole                            40 mg daily                           - Await pathology results.                           - Repeat upper endoscopy after studies are complete                            for surveillance based on pathology results. CBC                            today. See colonoscopy report.                           - Return to GI office after studies are complete. Procedure Code(s):        --- Professional ---                           [phone], Esophagogastroduodenoscopy, flexible,                            transoral; with biopsy, single or multiple Diagnosis Code(s):        --- Professional ---  K22.70, Barrett's esophagus without dysplasia                           K44.9, Diaphragmatic hernia without obstruction or                            gangrene                           R12, Heartburn CPT copyright 2016 American Medical Association. All rights reserved. The codes documented in this report are preliminary and upon coder review may  be revised to meet current compliance requirements. Gerrit Friendsobert M. Khai Arrona, MD Gennette Pacobert Michael Mathew Storck, MD 10/28/2015 9:09:25 AM This report has been signed electronically. Number of Addenda: 0

## 2015-10-28 NOTE — Anesthesia Procedure Notes (Signed)
Procedure Name: MAC Date/Time: 10/28/2015 7:32 AM Performed by: Franco NonesYATES, TRUE Shackleford S Pre-anesthesia Checklist: Patient identified, Emergency Drugs available, Suction available, Timeout performed and Patient being monitored Patient Re-evaluated:Patient Re-evaluated prior to inductionOxygen Delivery Method: Non-rebreather mask

## 2015-10-31 ENCOUNTER — Encounter: Payer: Self-pay | Admitting: Internal Medicine

## 2015-11-01 ENCOUNTER — Telehealth: Payer: Self-pay

## 2015-11-01 NOTE — Telephone Encounter (Signed)
Per RMR-  Send letter to patient.  Send copy of letter with path to referring provider and PCP.   Offer ov in 1 year if not already scheduled

## 2015-11-01 NOTE — Telephone Encounter (Signed)
Reminder in epic °

## 2015-11-01 NOTE — Telephone Encounter (Signed)
Letter mailed to the pt.  Please nic.  

## 2015-11-04 ENCOUNTER — Encounter (HOSPITAL_COMMUNITY): Payer: Self-pay | Admitting: Internal Medicine

## 2015-11-10 ENCOUNTER — Ambulatory Visit: Payer: BLUE CROSS/BLUE SHIELD | Admitting: Gastroenterology

## 2017-12-02 ENCOUNTER — Other Ambulatory Visit: Payer: Self-pay

## 2017-12-02 ENCOUNTER — Encounter (HOSPITAL_COMMUNITY): Payer: Self-pay

## 2017-12-02 ENCOUNTER — Inpatient Hospital Stay (HOSPITAL_COMMUNITY)
Admission: EM | Admit: 2017-12-02 | Discharge: 2017-12-04 | DRG: 066 | Disposition: A | Discharge status: Rehabilitation Facility | Payer: Medicare Other | Attending: Internal Medicine | Admitting: Internal Medicine

## 2017-12-02 ENCOUNTER — Emergency Department (HOSPITAL_COMMUNITY): Payer: Medicare Other

## 2017-12-02 DIAGNOSIS — H9192 Unspecified hearing loss, left ear: Secondary | ICD-10-CM | POA: Diagnosis present

## 2017-12-02 DIAGNOSIS — K5900 Constipation, unspecified: Secondary | ICD-10-CM | POA: Diagnosis present

## 2017-12-02 DIAGNOSIS — R200 Anesthesia of skin: Secondary | ICD-10-CM | POA: Diagnosis present

## 2017-12-02 DIAGNOSIS — Z79899 Other long term (current) drug therapy: Secondary | ICD-10-CM | POA: Diagnosis not present

## 2017-12-02 DIAGNOSIS — Z9181 History of falling: Secondary | ICD-10-CM | POA: Diagnosis not present

## 2017-12-02 DIAGNOSIS — I63442 Cerebral infarction due to embolism of left cerebellar artery: Secondary | ICD-10-CM | POA: Diagnosis not present

## 2017-12-02 DIAGNOSIS — I639 Cerebral infarction, unspecified: Secondary | ICD-10-CM | POA: Diagnosis not present

## 2017-12-02 DIAGNOSIS — R42 Dizziness and giddiness: Secondary | ICD-10-CM | POA: Diagnosis present

## 2017-12-02 DIAGNOSIS — K219 Gastro-esophageal reflux disease without esophagitis: Secondary | ICD-10-CM | POA: Diagnosis present

## 2017-12-02 DIAGNOSIS — E669 Obesity, unspecified: Secondary | ICD-10-CM | POA: Diagnosis present

## 2017-12-02 DIAGNOSIS — I6601 Occlusion and stenosis of right middle cerebral artery: Secondary | ICD-10-CM | POA: Diagnosis present

## 2017-12-02 DIAGNOSIS — I69398 Other sequelae of cerebral infarction: Secondary | ICD-10-CM | POA: Diagnosis present

## 2017-12-02 DIAGNOSIS — H5509 Other forms of nystagmus: Secondary | ICD-10-CM | POA: Diagnosis present

## 2017-12-02 DIAGNOSIS — Z7901 Long term (current) use of anticoagulants: Secondary | ICD-10-CM | POA: Diagnosis not present

## 2017-12-02 DIAGNOSIS — Z6837 Body mass index (BMI) 37.0-37.9, adult: Secondary | ICD-10-CM | POA: Diagnosis not present

## 2017-12-02 DIAGNOSIS — I482 Chronic atrial fibrillation, unspecified: Secondary | ICD-10-CM

## 2017-12-02 DIAGNOSIS — I10 Essential (primary) hypertension: Secondary | ICD-10-CM | POA: Diagnosis present

## 2017-12-02 DIAGNOSIS — I651 Occlusion and stenosis of basilar artery: Secondary | ICD-10-CM | POA: Diagnosis present

## 2017-12-02 DIAGNOSIS — E782 Mixed hyperlipidemia: Secondary | ICD-10-CM | POA: Diagnosis not present

## 2017-12-02 DIAGNOSIS — I4891 Unspecified atrial fibrillation: Secondary | ICD-10-CM | POA: Diagnosis present

## 2017-12-02 DIAGNOSIS — I63512 Cerebral infarction due to unspecified occlusion or stenosis of left middle cerebral artery: Secondary | ICD-10-CM | POA: Diagnosis not present

## 2017-12-02 DIAGNOSIS — H538 Other visual disturbances: Secondary | ICD-10-CM | POA: Diagnosis present

## 2017-12-02 DIAGNOSIS — H532 Diplopia: Secondary | ICD-10-CM | POA: Diagnosis present

## 2017-12-02 DIAGNOSIS — R278 Other lack of coordination: Secondary | ICD-10-CM | POA: Diagnosis present

## 2017-12-02 DIAGNOSIS — M549 Dorsalgia, unspecified: Secondary | ICD-10-CM | POA: Diagnosis present

## 2017-12-02 DIAGNOSIS — Z87891 Personal history of nicotine dependence: Secondary | ICD-10-CM

## 2017-12-02 DIAGNOSIS — E785 Hyperlipidemia, unspecified: Secondary | ICD-10-CM | POA: Diagnosis present

## 2017-12-02 DIAGNOSIS — R29810 Facial weakness: Secondary | ICD-10-CM | POA: Diagnosis present

## 2017-12-02 DIAGNOSIS — R270 Ataxia, unspecified: Secondary | ICD-10-CM | POA: Diagnosis present

## 2017-12-02 DIAGNOSIS — M1990 Unspecified osteoarthritis, unspecified site: Secondary | ICD-10-CM | POA: Diagnosis present

## 2017-12-02 HISTORY — DX: Unspecified atrial fibrillation: I48.91

## 2017-12-02 LAB — CBC WITH DIFFERENTIAL/PLATELET
Basophils Absolute: 0 10*3/uL (ref 0.0–0.1)
Basophils Relative: 0 %
Eosinophils Absolute: 0.1 10*3/uL (ref 0.0–0.7)
Eosinophils Relative: 1 %
HCT: 36.8 % — ABNORMAL LOW (ref 39.0–52.0)
Hemoglobin: 11.9 g/dL — ABNORMAL LOW (ref 13.0–17.0)
Lymphocytes Relative: 6 %
Lymphs Abs: 0.8 10*3/uL (ref 0.7–4.0)
MCH: 29.8 pg (ref 26.0–34.0)
MCHC: 32.3 g/dL (ref 30.0–36.0)
MCV: 92 fL (ref 78.0–100.0)
Monocytes Absolute: 1.1 10*3/uL — ABNORMAL HIGH (ref 0.1–1.0)
Monocytes Relative: 8 %
Neutro Abs: 11.4 10*3/uL — ABNORMAL HIGH (ref 1.7–7.7)
Neutrophils Relative %: 85 %
Platelets: 173 10*3/uL (ref 150–400)
RBC: 4 MIL/uL — ABNORMAL LOW (ref 4.22–5.81)
RDW: 13.1 % (ref 11.5–15.5)
WBC: 13.3 10*3/uL — ABNORMAL HIGH (ref 4.0–10.5)

## 2017-12-02 LAB — BASIC METABOLIC PANEL
Anion gap: 10 (ref 5–15)
BUN: 21 mg/dL — ABNORMAL HIGH (ref 6–20)
CO2: 25 mmol/L (ref 22–32)
Calcium: 9 mg/dL (ref 8.9–10.3)
Chloride: 106 mmol/L (ref 101–111)
Creatinine, Ser: 0.95 mg/dL (ref 0.61–1.24)
GFR calc Af Amer: >60 mL/min (ref >60)
GFR calc non Af Amer: >60 mL/min (ref >60)
Glucose, Bld: 170 mg/dL — ABNORMAL HIGH (ref 65–99)
Potassium: 3.8 mmol/L (ref 3.5–5.1)
Sodium: 141 mmol/L (ref 135–145)

## 2017-12-02 LAB — PROTIME-INR
INR: 1.25
Prothrombin Time: 15.6 seconds — ABNORMAL HIGH (ref 11.4–15.2)

## 2017-12-02 LAB — APTT: aPTT: 37 seconds — ABNORMAL HIGH (ref 24–36)

## 2017-12-02 LAB — CBG MONITORING, ED: Glucose-Capillary: 162 mg/dL — ABNORMAL HIGH (ref 65–99)

## 2017-12-02 MED ORDER — STROKE: EARLY STAGES OF RECOVERY BOOK
Freq: Once | Status: AC
  Administered 2017-12-02: 19:00:00
  Filled 2017-12-02: qty 1

## 2017-12-02 MED ORDER — ACETAMINOPHEN 325 MG PO TABS
650.0000 mg | ORAL_TABLET | ORAL | Status: DC | PRN
  Administered 2017-12-03 – 2017-12-04 (×4): 650 mg via ORAL
  Filled 2017-12-02 (×5): qty 2

## 2017-12-02 MED ORDER — ASPIRIN 325 MG PO TABS
325.0000 mg | ORAL_TABLET | Freq: Every day | ORAL | Status: DC
  Administered 2017-12-02 – 2017-12-04 (×3): 325 mg via ORAL
  Filled 2017-12-02 (×3): qty 1

## 2017-12-02 MED ORDER — SODIUM CHLORIDE 0.9 % IV SOLN
INTRAVENOUS | Status: DC
  Administered 2017-12-02 – 2017-12-03 (×2): via INTRAVENOUS

## 2017-12-02 MED ORDER — ACETAMINOPHEN 160 MG/5ML PO SOLN: 650.0000 mg | ORAL | Status: DC | PRN

## 2017-12-02 MED ORDER — ACETAMINOPHEN 650 MG RE SUPP: 650.0000 mg | RECTAL | Status: DC | PRN

## 2017-12-02 MED ORDER — APIXABAN 5 MG PO TABS
5.0000 mg | ORAL_TABLET | Freq: Two times a day (BID) | ORAL | Status: DC
  Administered 2017-12-02 – 2017-12-04 (×4): 5 mg via ORAL
  Filled 2017-12-02 (×4): qty 1

## 2017-12-02 MED ORDER — ONDANSETRON HCL 4 MG/2ML IJ SOLN: 4.0000 mg | Freq: Four times a day (QID) | INTRAMUSCULAR | Status: DC | PRN

## 2017-12-02 MED ORDER — AMLODIPINE BESYLATE 5 MG PO TABS
5.0000 mg | ORAL_TABLET | Freq: Every day | ORAL | Status: DC
  Administered 2017-12-02 – 2017-12-04 (×3): 5 mg via ORAL
  Filled 2017-12-02 (×3): qty 1

## 2017-12-02 MED ORDER — SENNOSIDES-DOCUSATE SODIUM 8.6-50 MG PO TABS: 1.0000 | ORAL_TABLET | Freq: Every evening | ORAL | Status: DC | PRN

## 2017-12-02 MED ORDER — ASPIRIN 300 MG RE SUPP
300.0000 mg | Freq: Every day | RECTAL | Status: DC
  Filled 2017-12-02: qty 1

## 2017-12-02 MED ORDER — ATORVASTATIN CALCIUM 40 MG PO TABS
80.0000 mg | ORAL_TABLET | Freq: Every day | ORAL | Status: DC
  Administered 2017-12-03 – 2017-12-04 (×2): 80 mg via ORAL
  Filled 2017-12-02 (×2): qty 2

## 2017-12-02 MED ORDER — PANTOPRAZOLE SODIUM 40 MG PO TBEC
40.0000 mg | DELAYED_RELEASE_TABLET | Freq: Every day | ORAL | Status: DC
  Administered 2017-12-02 – 2017-12-04 (×3): 40 mg via ORAL
  Filled 2017-12-02 (×3): qty 1

## 2017-12-02 MED ORDER — ATORVASTATIN CALCIUM 20 MG PO TABS: 20.0000 mg | ORAL_TABLET | Freq: Every day | ORAL | Status: DC

## 2017-12-02 MED ORDER — PROMETHAZINE HCL 25 MG/ML IJ SOLN
12.5000 mg | Freq: Once | INTRAMUSCULAR | Status: AC
  Administered 2017-12-02: 12.5 mg via INTRAVENOUS
  Filled 2017-12-02: qty 1

## 2017-12-02 NOTE — ED Triage Notes (Addendum)
Pt started on eliquis last week for a fib. Pt reports feeling fine last night. Reports he has been feeling dizzy of on since starting meds. Woke up at 3 am with weakness, and difficulty walking. States dizziness has iincrease and has pain in back of head to around to left eye  Described as numbness. Pt reports seeing double, which has improved

## 2017-12-02 NOTE — Plan of Care (Signed)
progressing 

## 2017-12-02 NOTE — ED Notes (Signed)
EDP at bedside  

## 2017-12-02 NOTE — ED Provider Notes (Signed)
[facility] Provider Note   CSN: [id number] Arrival date & time: 12/02/17  0846     History   Chief Complaint Chief Complaint  Patient presents with  . Dizziness    HPI Miguel Alvarez is a 67 y.o. male.  HPI   67 year old male with dizziness.  He went to bed last night around 9 PM in his usual state of health.  He woke up around 3 AM and felt off balance.  Denies room spinning sensation but felt like he just was not very coordinated.  Numbness type sensation in the left side of his head but not really painful.  He is also had some intermittent horizontal diplopia which is currently resolved.  Recently diagnosed atrial fibrillation and on Eliquis.  Past Medical History:  Diagnosis Date  . Arthritis    osteoarthritis  . Atrial fibrillation (HCC)   . GERD (gastroesophageal reflux disease)     Patient Active Problem List   Diagnosis Date Noted  . Dizziness 12/02/2017  . Mucosal abnormality of esophagus   . Diverticulosis of colon without hemorrhage   . Blood in stool   . Barrett's esophagus 09/03/2015  . GERD (gastroesophageal reflux disease) 09/03/2015  . Normocytic anemia 09/03/2015  . Heme positive stool 09/03/2015    Past Surgical History:  Procedure Laterality Date  . COLONOSCOPY  pandya   6-7 years ago, some polyps  . COLONOSCOPY WITH PROPOFOL N/A 10/28/2015   Procedure: COLONOSCOPY WITH PROPOFOL;  Surgeon: Corbin Adeobert M Rourk, MD;  Location: [facility];  Service: Endoscopy;  Laterality: N/A;  0730  . ESOPHAGOGASTRODUODENOSCOPY     Barrett's esophagus per patient  . ESOPHAGOGASTRODUODENOSCOPY (EGD) WITH PROPOFOL N/A 10/28/2015   Procedure: ESOPHAGOGASTRODUODENOSCOPY (EGD) WITH PROPOFOL;  Surgeon: Corbin Adeobert M Rourk, MD;  Location: [facility];  Service: Endoscopy;  Laterality: N/A;  . removal of bone foot Left    age 67        Home Medications    Prior to Admission medications   Medication Sig Start Date End Date Taking? Authorizing  Provider  amLODipine (NORVASC) 5 MG tablet Take 5 mg by mouth daily. 09/21/17  Yes [provider]  apixaban (ELIQUIS) 5 MG TABS tablet Take 5 mg by mouth 2 (two) times daily.   Yes [provider]  atorvastatin (LIPITOR) 20 MG tablet Take 20 mg by mouth daily. 09/21/17  Yes [provider]  Dexlansoprazole (DEXILANT) 30 MG capsule Take 30 mg by mouth daily.   Yes [provider]    Family History Family History  Problem Relation Age of Onset  . Colon cancer Neg Hx     Social History Social History   Tobacco Use  . Smoking status: Former Smoker    Packs/day: 1.00    Years: 40.00    Pack years: 40.00    Types: Cigarettes    Last attempt to quit: 09/03/2007    Years since quitting: 10.2  . Smokeless tobacco: Never Used  Substance Use Topics  . Alcohol use: Not Currently    Alcohol/week: 7.2 oz    Types: 12 Cans of beer per week    Comment: beer, two nights a week (6-8 beers per night).  used to drink more  . Drug use: No     Allergies   Patient has no known allergies.   Review of Systems Review of Systems  All systems reviewed and negative, other than as noted in HPI.  Physical Exam Updated Vital Signs BP Marland Kitchen(!)  144/82 (BP Location: Right Arm)   Pulse 60   Temp 98.7 F (37.1 C) (Oral)   Resp 20   Ht 6\' 2"  (1.88 m)   Wt 131.9 kg (290 lb 12.6 oz)   SpO2 98%   BMI 37.33 kg/m   Physical Exam  Constitutional: He is oriented to person, place, and time. He appears well-developed and well-nourished. No distress.  HENT:  Head: Normocephalic and atraumatic.  Eyes: Conjunctivae are normal. Right eye exhibits no discharge. Left eye exhibits no discharge.  Horizontal nystagmus more noticeable with R gaze with fast phase to R  Neck: Neck supple.  Cardiovascular: Normal rate, regular rhythm and normal heart sounds. Exam reveals no gallop and no friction rub.  No murmur heard. Pulmonary/Chest: Effort normal and breath sounds normal. No  respiratory distress.  Abdominal: Soft. He exhibits no distension. There is no tenderness.  Musculoskeletal: He exhibits no edema or tenderness.  Neurological: He is alert and oriented to person, place, and time. He displays normal reflexes. No cranial nerve deficit. He exhibits normal muscle tone.  Good finger-nose testing bilaterally.  Skin: Skin is warm and dry.  Psychiatric: He has a normal mood and affect. His behavior is normal. Thought content normal.  Nursing note and vitals reviewed.    ED Treatments / Results  Labs (all labs ordered are listed, but only abnormal results are displayed) Labs Reviewed  CBC WITH DIFFERENTIAL/PLATELET - Abnormal; Notable for the following components:      Result Value   WBC 13.3 (*)    RBC 4.00 (*)    Hemoglobin 11.9 (*)    HCT 36.8 (*)    Neutro Abs 11.4 (*)    Monocytes Absolute 1.1 (*)    All other components within normal limits  BASIC METABOLIC PANEL - Abnormal; Notable for the following components:   Glucose, Bld 170 (*)    BUN 21 (*)    All other components within normal limits  APTT - Abnormal; Notable for the following components:   aPTT 37 (*)    All other components within normal limits  PROTIME-INR - Abnormal; Notable for the following components:   Prothrombin Time 15.6 (*)    All other components within normal limits  CBG MONITORING, ED - Abnormal; Notable for the following components:   Glucose-Capillary 162 (*)    All other components within normal limits    EKG EKG Interpretation  Date/Time:  Sunday December 02 2017 08:49:31 EDT Ventricular Rate:  79 PR Interval:    QRS Duration: 95 QT Interval:  418 QTC Calculation: 480 R Axis:   82 Text Interpretation:  Atrial fibrillation Borderline right axis deviation Anteroseptal infarct, old Nonspecific T abnormalities, lateral leads Baseline wander in lead(s) III V3 Confirmed by Raeford Razor [phone]) on 12/02/2017 9:46:06 AM   Radiology Ct Head Wo Contrast  Result Date:  12/02/2017 CLINICAL DATA:  Weakness, dizziness, difficulty walking. EXAM: CT HEAD WITHOUT CONTRAST TECHNIQUE: Contiguous axial images were obtained from the base of the skull through the vertex without intravenous contrast. COMPARISON:  None. FINDINGS: Brain: Chronic microvascular disease within the deep white matter. No acute intracranial abnormality. Specifically, no hemorrhage, hydrocephalus, mass lesion, acute infarction, or significant intracranial injury. Vascular: No hyperdense vessel or unexpected calcification. Skull: No acute calvarial abnormality. Sinuses/Orbits: Visualized paranasal sinuses and mastoids clear. Orbital soft tissues unremarkable. 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 No acute intracranial abnormality. Electronically Signed   By: Charlett Nose M.D.  On: 12/02/2017 09:36    Procedures Procedures (including critical care time)  Medications Ordered in ED Medications  promethazine (PHENERGAN) injection 12.5 mg (12.5 mg Intravenous Given 12/02/17 1108)     Initial Impression / Assessment and Plan / ED Course  I have reviewed the triage vital signs and the nursing notes.  Pertinent labs & imaging results that were available during my care of the patient were reviewed by me and considered in my medical decision making (see chart for details).     67 year old male with dizziness.  He describes sensation of being off balance.  Not clearly vertigo.  He denies room spinning sensation.  He is also complaining of intermittent diplopia which is not consistent with a peripheral etiology.  CT of head without acute abnormality.  Will admit for further work-up.  Final Clinical Impressions(s) / ED Diagnoses   Final diagnoses:  Dizziness    ED Discharge Orders    None       Raeford Razor, MD 12/02/17 1550

## 2017-12-02 NOTE — H&P (Signed)
History and Physical    Miguel Romphomas Myrie ZOX:[id number] DOB: 08/21/1950 DOA: 12/02/2017  Referring MD/NP/PA: Raeford RazorStephen Kohut, EDP PCP: Melvyn Netholan, Frank, NP  Patient coming from: Home  Chief Complaint: Dizziness  HPI: Miguel Alvarez is a 67 y.o. male with history of hypertension and hyperlipidemia who comes into the hospital today because of dizziness.  He states he went to bed last night around 9 PM and was in his usual state of health.  This morning he woke up at around 3 AM to go to the bathroom and fell out of bed, has been severely off balance and with severe dizziness.  He states that he has not been very coordinated since.  He has been having difficulty ambulating and notices that he falls to the left side.  He also notes blurry vision and double vision.  He also states that the left side of his face is numb and he is having difficulty hearing out of his left ear.  In the ED vital signs are noted to be stable, slightly hypertensive, labs are significant for WBC count of 13.3 and a hemoglobin of 11.9 but are otherwise within normal limits, CT scan of the head shows mild chronic small vessel disease throughout the deep white matter but no acute intracranial abnormality.  EKG shows atrial fibrillation at a rate of 79.  Admission requested for further evaluation and management.  Past Medical/Surgical History: Past Medical History:  Diagnosis Date  . Arthritis    osteoarthritis  . Atrial fibrillation (HCC)   . GERD (gastroesophageal reflux disease)     Past Surgical History:  Procedure Laterality Date  . COLONOSCOPY  pandya   6-7 years ago, some polyps  . COLONOSCOPY WITH PROPOFOL N/A 10/28/2015   Procedure: COLONOSCOPY WITH PROPOFOL;  Surgeon: Corbin Adeobert M Rourk, MD;  Location: [facility];  Service: Endoscopy;  Laterality: N/A;  0730  . ESOPHAGOGASTRODUODENOSCOPY     Barrett's esophagus per patient  . ESOPHAGOGASTRODUODENOSCOPY (EGD) WITH PROPOFOL N/A 10/28/2015   Procedure:  ESOPHAGOGASTRODUODENOSCOPY (EGD) WITH PROPOFOL;  Surgeon: Corbin Adeobert M Rourk, MD;  Location: [facility];  Service: Endoscopy;  Laterality: N/A;  . removal of bone foot Left    age 67    Social History:  reports that he quit smoking about 10 years ago. His smoking use included cigarettes. He has a 40.00 pack-year smoking history. He has never used smokeless tobacco. He reports that he drank about 7.2 oz of alcohol per week. He reports that he does not use drugs.  Allergies: No Known Allergies  Family History:  Family History  Problem Relation Age of Onset  . Colon cancer Neg Hx     Prior to Admission medications   Medication Sig Start Date End Date Taking? Authorizing Provider  amLODipine (NORVASC) 5 MG tablet Take 5 mg by mouth daily. 09/21/17  Yes [provider]  apixaban (ELIQUIS) 5 MG TABS tablet Take 5 mg by mouth 2 (two) times daily.   Yes [provider]  atorvastatin (LIPITOR) 20 MG tablet Take 20 mg by mouth daily. 09/21/17  Yes [provider]  Dexlansoprazole (DEXILANT) 30 MG capsule Take 30 mg by mouth daily.   Yes [provider]    Review of Systems:  Constitutional: Denies fever, chills, diaphoresis, appetite change and fatigue.  HEENT: Denies photophobia, eye pain, redness,  ear pain, congestion, sore throat, rhinorrhea, sneezing, mouth sores, trouble swallowing, neck pain, neck stiffness and tinnitus.   Respiratory: Denies SOB, DOE, cough, chest tightness,  and wheezing.  Cardiovascular: Denies chest pain, palpitations and leg swelling.  Gastrointestinal: Denies nausea, vomiting, abdominal pain, diarrhea, constipation, blood in stool and abdominal distention.  Genitourinary: Denies dysuria, urgency, frequency, hematuria, flank pain and difficulty urinating.  Endocrine: Denies: hot or cold intolerance, sweats, changes in hair or nails, polyuria, polydipsia. Musculoskeletal: Denies myalgias, back pain, joint swelling, arthralgias and  gait problem.  Skin: Denies pallor, rash and wound.  Neurological: Denies  seizures,weakness, nd headaches.  Hematological: Denies adenopathy. Easy bruising, personal or family bleeding history  Psychiatric/Behavioral: Denies suicidal ideation, mood changes, confusion, nervousness, sleep disturbance and agitation    Physical Exam: Vitals:   12/02/17 1030 12/02/17 1130 12/02/17 1200 12/02/17 1215  BP: (!) 152/65 140/73 136/64 (!) 144/82  Pulse: 62 64 69 60  Resp: (!) 23 (!) 22 20 20   Temp:    98.7 F (37.1 C)  TempSrc:    Oral  SpO2: 98% 96% 95% 98%  Weight:    131.9 kg (290 lb 12.6 oz)  Height:    6\' 2"  (1.88 m)     Constitutional: NAD, calm, comfortable Eyes: PERRL, lids and conjunctivae normal, horizontal nystagmus with fast beat to the right ENMT: Mucous membranes are moist. Posterior pharynx clear of any exudate or lesions.Normal dentition.  Neck: normal, supple, no masses, no thyromegaly Respiratory: clear to auscultation bilaterally, no wheezing, no crackles. Normal respiratory effort. No accessory muscle use.  Cardiovascular: Irregular rhythm, regular rate, no murmurs / rubs / gallops. No extremity edema. 2+ pedal pulses. No carotid bruits.  Abdomen: no tenderness, no masses palpated. No hepatosplenomegaly. Bowel sounds positive.  Musculoskeletal: no clubbing / cyanosis. No joint deformity upper and lower extremities. Good ROM, no contractures. Normal muscle tone.  Skin: no rashes, lesions, ulcers. No induration Neurologic: Facial droop on the left. Sensation intact, DTR normal. Strength 5/5 in all 4.  Psychiatric: Normal judgment and insight. Alert and oriented x 3. Normal mood.    Labs on Admission: I have personally reviewed the following labs and imaging studies  CBC: Recent Labs  Lab 12/02/17 0906  WBC 13.3*  NEUTROABS 11.4*  HGB 11.9*  HCT 36.8*  MCV 92.0  PLT 173   Basic Metabolic Panel: Recent Labs  Lab 12/02/17 0906  NA 141  K 3.8  CL 106  CO2  25  GLUCOSE 170*  BUN 21*  CREATININE 0.95  CALCIUM 9.0   GFR: Estimated Creatinine Clearance: 110.5 mL/min (by C-G formula based on SCr of 0.95 mg/dL). Liver Function Tests: No results for input(s): AST, ALT, ALKPHOS, BILITOT, PROT, ALBUMIN in the last 168 hours. No results for input(s): LIPASE, AMYLASE in the last 168 hours. No results for input(s): AMMONIA in the last 168 hours. Coagulation Profile: Recent Labs  Lab 12/02/17 0906  INR 1.25   Cardiac Enzymes: No results for input(s): CKTOTAL, CKMB, CKMBINDEX, TROPONINI in the last 168 hours. BNP (last 3 results) No results for input(s): PROBNP in the last 8760 hours. HbA1C: No results for input(s): HGBA1C in the last 72 hours. CBG: Recent Labs  Lab 12/02/17 0900  GLUCAP 162*   Lipid Profile: No results for input(s): CHOL, HDL, LDLCALC, TRIG, CHOLHDL, LDLDIRECT in the last 72 hours. Thyroid Function Tests: No results for input(s): TSH, T4TOTAL, FREET4, T3FREE, THYROIDAB in the last 72 hours. Anemia Panel: No results for input(s): VITAMINB12, FOLATE, FERRITIN, TIBC, IRON, RETICCTPCT in the last 72 hours. Urine analysis: No results found for: COLORURINE, APPEARANCEUR, LABSPEC, PHURINE, GLUCOSEU, HGBUR, BILIRUBINUR, KETONESUR, PROTEINUR, UROBILINOGEN, NITRITE, LEUKOCYTESUR Sepsis Labs: @LABRCNTIP (procalcitonin:4,lacticidven:4) )No  results found for this or any previous visit (from the past 240 hour(s)).   Radiological Exams on Admission: Ct Head Wo Contrast  Result Date: 12/02/2017 CLINICAL DATA:  Weakness, dizziness, difficulty walking. EXAM: CT HEAD WITHOUT CONTRAST TECHNIQUE: Contiguous axial images were obtained from the base of the skull through the vertex without intravenous contrast. COMPARISON:  None. FINDINGS: Brain: Chronic microvascular disease within the deep white matter. No acute intracranial abnormality. Specifically, no hemorrhage, hydrocephalus, mass lesion, acute infarction, or significant intracranial  injury. Vascular: No hyperdense vessel or unexpected calcification. Skull: No acute calvarial abnormality. Sinuses/Orbits: Visualized paranasal sinuses and mastoids clear. Orbital soft tissues unremarkable. 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 No acute intracranial abnormality. Electronically Signed   By: Charlett Nose M.D.   On: 12/02/2017 09:36    EKG: Independently reviewed.  Atrial fibrillation at a rate of 79  Assessment/Plan Principal Problem:   Acute CVA (cerebrovascular accident) (HCC) Active Problems:   GERD (gastroesophageal reflux disease)   Dizziness   Hyperlipidemia   Atrial fibrillation, chronic (HCC)    Acute CVA -CT negative for acute findings, however with his ataxia, horizontal nystagmus, severe dizziness I suspect a posterior circulation CVA. -He was just diagnosed with atrial fibrillation within the past 2 weeks and started on Eliquis by his PCP, he has an appointment with cardiology coming up but has not seen them yet.  This could certainly be a source of embolic phenomena. -Check 2D echo, carotid Dopplers, MRI of the brain. -Obtain PT/OT/ST evaluation. -Neurology consultation will be requested as well.  GERD -Continue PPI  Atrial fibrillation -Currently rate controlled, continue Eliquis for anticoagulation.  Hyperlipidemia -Check fasting lipid profile. -Is on Lipitor 20 mg at home, given presumption of acute CVA will place on high intensity statin, 80 mg.   DVT prophylaxis: Eliquis Code Status: Full code Family Communication: Wife at bedside updated on plan of care and all questions answered Disposition Plan: Pending physical therapy evaluation Consults called: Neurology Admission status: Admit - It is my clinical opinion that admission to INPATIENT is reasonable and necessary because of the expectation that this patient will require hospital care that crosses at least 2 midnights to treat this condition  based on the medical complexity of the problems presented.  Given the aforementioned information, the predictability of an adverse outcome is felt to be significant.      Time Spent: 95 minutes  Estela Philip Aspen MD [facility] Pager [phone]  If 7PM-7AM, please contact night-coverage [email or web address] Password [email or web address]  12/02/2017, 5:06 PM

## 2017-12-02 NOTE — ED Notes (Signed)
Report given to 300, RN at this time.

## 2017-12-03 ENCOUNTER — Inpatient Hospital Stay (HOSPITAL_COMMUNITY): Payer: Medicare Other

## 2017-12-03 DIAGNOSIS — I63512 Cerebral infarction due to unspecified occlusion or stenosis of left middle cerebral artery: Secondary | ICD-10-CM

## 2017-12-03 DIAGNOSIS — I482 Chronic atrial fibrillation: Secondary | ICD-10-CM

## 2017-12-03 LAB — LIPID PANEL
CHOL/HDL RATIO: 3.1 ratio
Cholesterol: 117 mg/dL (ref 0–200)
HDL: 38 mg/dL — AB (ref >40)
LDL CALC: 69 mg/dL (ref 0–99)
Triglycerides: 52 mg/dL (ref <150)
VLDL: 10 mg/dL (ref 0–40)

## 2017-12-03 LAB — HEMOGLOBIN A1C
Hgb A1c MFr Bld: 6 % — ABNORMAL HIGH (ref 4.8–5.6)
Mean Plasma Glucose: 125.5 mg/dL

## 2017-12-03 LAB — VITAMIN B12: VITAMIN B 12: 304 pg/mL (ref 180–914)

## 2017-12-03 LAB — ECHOCARDIOGRAM COMPLETE
HEIGHTINCHES: 74 in
WEIGHTICAEL: 4652.59 [oz_av]

## 2017-12-03 MED ORDER — IBUPROFEN 400 MG PO TABS
400.0000 mg | ORAL_TABLET | Freq: Three times a day (TID) | ORAL | Status: DC | PRN
  Administered 2017-12-03 – 2017-12-04 (×2): 400 mg via ORAL
  Filled 2017-12-03 (×2): qty 1

## 2017-12-03 MED ORDER — PERFLUTREN LIPID MICROSPHERE
1.0000 mL | INTRAVENOUS | Status: AC | PRN
  Administered 2017-12-03: 2 mL via INTRAVENOUS
  Filled 2017-12-03: qty 10

## 2017-12-03 NOTE — Evaluation (Signed)
Physical Therapy Evaluation Patient Details Name: Miguel Alvarez MRN: [id number] DOB: [birth year] Today's Date: 12/03/2017   History of Present Illness  Miguel Alvarez is a 67 y.o. male with history of hypertension and hyperlipidemia who comes into the hospital today because of dizziness.  He states he went to bed last night around 9 PM and was in his usual state of health.  This morning he woke up at around 3 AM to go to the bathroom and fell out of bed, has been severely off balance and with severe dizziness.  He states that he has not been very coordinated since.  He has been having difficulty ambulating and notices that he falls to the left side.  He also notes blurry vision and double vision.  He also states that the left side of his face is numb and he is having difficulty hearing out of his left ear.  In the ED vital signs are noted to be stable, slightly hypertensive, labs are significant for WBC count of 13.3 and a hemoglobin of 11.9 but are otherwise within normal limits, CT scan of the head shows mild chronic small vessel disease throughout the deep white matter but no acute intracranial abnormality.  EKG shows atrial fibrillation at a rate of 79.    Clinical Impression  Patient has to use bed rail for sitting up, occasionally leans to the left when seated, very unsteady on feet with ataxic gait and reach for nearby objects for support when attempting to walk without using an AD, severe fall risk at this time, demonstrated improvement using RW, but tends to drift/lean to left after fatiguing.  Patient will benefit from continued physical therapy in hospital and recommended venue below to increase strength, balance, endurance for safe ADLs and gait.    Follow Up Recommendations [facility]    Equipment Recommendations  Rolling walker with 5" wheels    Recommendations for Other Services       Precautions / Restrictions Precautions Precautions: Fall Restrictions Weight Bearing Restrictions:  No      Mobility  Bed Mobility Overal bed mobility: Modified Independent                Transfers Overall transfer level: Needs assistance Equipment used: None;Rolling walker (2 wheeled) Transfers: Sit to/from [employer] Sit to Stand: Min assist;Mod assist Stand pivot transfers: Min assist;Mod assist       General transfer comment: very unsteady with near fall without use of assistive device, safer using RW  Ambulation/Gait Ambulation/Gait assistance: Mod assist;Min assist Gait Distance (Feet): 55 Feet Assistive device: Rolling walker (2 wheeled) Gait Pattern/deviations: Decreased step length - left;Decreased stance time - left;Decreased stride length;Drifts right/left Gait velocity: decreased   General Gait Details: labored cadence with frequent leaning to left side especially once fatigued, c/o increasing weakness left side during gait training, mild drift to the left   Stairs            Wheelchair Mobility    Modified Rankin (Stroke Patients Only)       Balance Overall balance assessment: Needs assistance Sitting-balance support: Feet supported;No upper extremity supported Sitting balance-Leahy Scale: Fair Sitting balance - Comments: fair/good with occasional leaning to the left Postural control: Left lateral lean Standing balance support: No upper extremity supported;During functional activity Standing balance-Leahy Scale: Poor Standing balance comment: fair using RW  Pertinent Vitals/Pain Pain Assessment: 0-10 Pain Score: 2  Pain Location: numbness left side of face/mouth Pain Descriptors / Indicators: Numbness Pain Intervention(s): Limited activity within patient's tolerance;Monitored during session    Home Living Family/patient expects to be discharged to:: Private residence Living Arrangements: Spouse/significant other Available Help at Discharge: Family Type of Home: House Home  Access: Stairs to enter Entrance Stairs-Rails: None Secretary/administratorntrance Stairs-Number of Steps: 3 Home Layout: Laundry or work area in basement;Able to live on main level with bedroom/bathroom Home Equipment: None      Prior Function Level of Independence: Independent               Hand Dominance   Dominant Hand: Right    Extremity/Trunk Assessment   Upper Extremity Assessment Upper Extremity Assessment: Defer to OT evaluation    Lower Extremity Assessment Lower Extremity Assessment: Overall WFL for tasks assessed;LLE deficits/detail LLE Deficits / Details: grossly 4/5 LLE Sensation: decreased proprioception LLE Coordination: decreased gross motor    Cervical / Trunk Assessment Cervical / Trunk Assessment: Normal  Communication   Communication: No difficulties  Cognition Arousal/Alertness: Awake/alert Behavior During Therapy: WFL for tasks assessed/performed Overall Cognitive Status: Within Functional Limits for tasks assessed                                        General Comments      Exercises     Assessment/Plan    PT Assessment Patient needs continued PT services  PT Problem List Decreased strength;Decreased activity tolerance;Decreased balance;Decreased mobility       PT Treatment Interventions Gait training;Stair training;Functional mobility training;Therapeutic activities;Therapeutic exercise;Patient/family education    PT Goals (Current goals can be found in the Care Plan section)  Acute Rehab PT Goals Patient Stated Goal: return home after rehab Time For Goal Achievement: 12/17/17 Potential to Achieve Goals: Good    Frequency 7X/week   Barriers to discharge        Co-evaluation PT/OT/SLP Co-Evaluation/Treatment: Yes Reason for Co-Treatment: For patient/therapist safety PT goals addressed during session: Mobility/safety with mobility;Balance;Proper use of DME;Strengthening/ROM OT goals addressed during session: ADL's and  self-care;Proper use of Adaptive equipment and DME;Strengthening/ROM       AM-PAC PT "6 Clicks" Daily Activity  Outcome Measure Difficulty turning over in bed (including adjusting bedclothes, sheets and blankets)?: None Difficulty moving from lying on back to sitting on the side of the bed? : None Difficulty sitting down on and standing up from a chair with arms (e.g., wheelchair, bedside commode, etc,.)?: A Lot Help needed moving to and from a bed to chair (including a wheelchair)?: A Lot Help needed walking in hospital room?: A Lot Help needed climbing 3-5 steps with a railing? : A Lot 6 Click Score: 16    End of Session Equipment Utilized During Treatment: Gait belt Activity Tolerance: Patient tolerated treatment well;Patient limited by fatigue Patient left: in chair;with call bell/phone within reach;with family/visitor present Nurse Communication: Mobility status;Other (comment) PT Visit Diagnosis: Unsteadiness on feet (R26.81);Other abnormalities of gait and mobility (R26.89);Muscle weakness (generalized) (M62.81)    Time: [id number] PT Time Calculation (min) (ACUTE ONLY): 33 min   Charges:   PT Evaluation $PT Eval Moderate Complexity: 1 Mod PT Treatments $Therapeutic Activity: 23-37 mins   PT G Codes:        11:55 AM, 12/03/17 Ocie BobJames Brysen Shankman, MPT Physical Therapist with [facility] [phone] office [phone] mobile phone

## 2017-12-03 NOTE — Progress Notes (Signed)
Rehab admissions - I spoke with patient and his wife by phone.  They are both interested in inpatient rehab admission.  I do have beds available tomorrow, Tuesday, if patient is medically ready in am.  I will follow up with case manager in am regarding [facility].  Call me for questions.  #[phone]

## 2017-12-03 NOTE — Evaluation (Signed)
Occupational Therapy Evaluation Patient Details Name: Miguel Alvarez Haslam MRN: [id number] DOB: 09/18/1950 Today's Date: 12/03/2017    History of Present Illness Miguel Alvarez Kunkler is a 67 y.o. male with history of hypertension and hyperlipidemia who comes into the hospital today because of dizziness.  He states he went to bed last night around 9 PM and was in his usual state of health.  This morning he woke up at around 3 AM to go to the bathroom and fell out of bed, has been severely off balance and with severe dizziness.  He states that he has not been very coordinated since.  He has been having difficulty ambulating and notices that he falls to the left side.  He also notes blurry vision and double vision.  He also states that the left side of his face is numb and he is having difficulty hearing out of his left ear.  In the ED vital signs are noted to be stable, slightly hypertensive, labs are significant for WBC count of 13.3 and a hemoglobin of 11.9 but are otherwise within normal limits, CT scan of the head shows mild chronic small vessel disease throughout the deep white matter but no acute intracranial abnormality.  EKG shows atrial fibrillation at a rate of 79.   Clinical Impression   Patient and wife present upon therapy arrival and agreeable to participate in OT evaluation. BUE strength and A/ROM is WNL. Gross motor coordination is impaired and present more when patient is up standing versus sitting. Patient presents with very ataxic movement patterns when trying to coordinate walking with RW. When patient was returning to room while walking with OT/PT patient voices that his left leg and hand were beginning to weaken and he was having difficulty holding onto the walker with is left hand. Patient was able to make it back to recliner safely although with difficulty. Safety awareness decreased as patient used bathroom and stood up without informing therapists prior to. Recommend that patient discharge to  CIR before returning home as he was independent prior and will benefit from intense rehab services to increase coordination, safety awareness and functional performance during all daily tasks.     Follow Up Recommendations  [facility]    Equipment Recommendations  Tub/shower seat    Recommendations for Other Services Rehab consult     Precautions / Restrictions Precautions Precautions: Fall Restrictions Weight Bearing Restrictions: No      Mobility Bed Mobility Overal bed mobility: Modified Independent                         ADL either performed or assessed with clinical judgement   ADL Overall ADL's : Needs assistance/impaired     Lower Body Dressing: Set up;Sitting/lateral leans Lower Body Dressing Details (indicate cue type and reason): Patient was seated to doff/donn hospital socks.  Toilet Transfer: Minimal assistance;Regular Toilet;Grab bars;RW   Toileting- Clothing Manipulation and Hygiene: Minimal assistance;Sit to/from stand;Cueing for safety       Functional mobility during ADLs: Moderate assistance;Rolling walker General ADL Comments: During ambulation, PT provided physical assistance which appeared to vary from Min-Mod or Max depending on patient's frequent loss of balance towards the left. OT provided SBA assistance on right side to cue patient to keep walker wheels on ground.      Vision Baseline Vision/History: No visual deficits Patient Visual Report: Diplopia;Blurring of vision Vision Assessment?: Yes Eye Alignment: Within Functional Limits Ocular Range of Motion: Within Functional Limits Alignment/Gaze Preference: Within Defined  Limits Tracking/Visual Pursuits: Able to track stimulus in all quads without difficulty Convergence: Within functional limits Visual Fields: No apparent deficits Diplopia Assessment: Disappears with one eye closed Additional Comments: Nystagmus noted in both eyes: up/down motion.            Pertinent Vitals/Pain  Pain Assessment: No/denies pain Pain Location: Pt reports numbness in his mouth/face and rates it at a 2/10.     Hand Dominance Right   Extremity/Trunk Assessment Upper Extremity Assessment Upper Extremity Assessment: (Overall BUE strength is 5/5 with intact grip strength. Slight decrease in motor coordination during functional tasks. Ataxic movements when walking with PT.)     Communication Communication Communication: No difficulties   Cognition Arousal/Alertness: Awake/alert Behavior During Therapy: WFL for tasks assessed/performed Overall Cognitive Status: Within Functional Limits for tasks assessed                  Home Living Family/patient expects to be discharged to:: Inpatient rehab Living Arrangements: Spouse/significant other Available Help at Discharge: (TBD) Type of Home: House Home Access: Stairs to enter [employer] of Steps: 3   Home Layout: Laundry or work area in basement;Able to live on main level with bedroom/bathroom     Bathroom Shower/Tub: IT trainer: Standard     Home Equipment: None          Prior Functioning/Environment Level of Independence: Independent                 OT Problem List: Decreased knowledge of use of DME or AE;Decreased coordination;Decreased safety awareness;Impaired balance (sitting and/or standing)      OT Treatment/Interventions: Self-care/ADL training;Modalities;Balance training;Therapeutic exercise;Neuromuscular education;Therapeutic activities;Visual/perceptual remediation/compensation;DME and/or AE instruction;Manual therapy;Patient/family education    OT Goals(Current goals can be found in the care plan section) Acute Rehab OT Goals Patient Stated Goal: To go home OT Goal Formulation: With patient/family Time For Goal Achievement: 12/17/17 Potential to Achieve Goals: Good  OT Frequency: Min 2X/week    AM-PAC PT "6 Clicks" Daily Activity     Outcome Measure  Help from another person eating meals?: None Help from another person taking care of personal grooming?: A Little Help from another person toileting, which includes using toliet, bedpan, or urinal?: A Little Help from another person bathing (including washing, rinsing, drying)?: A Little Help from another person to put on and taking off regular upper body clothing?: None Help from another person to put on and taking off regular lower body clothing?: A Little 6 Click Score: 20   End of Session Equipment Utilized During Treatment: Gait belt;Rolling walker Nurse Communication: Mobility status  Activity Tolerance: Patient tolerated treatment well Patient left: in chair;with call bell/phone within reach;with chair alarm set;with nursing/sitter in room;with family/visitor present  OT Visit Diagnosis: Muscle weakness (generalized) (M62.81)                Time: 0830-0900 OT Time Calculation (min): 30 min Charges:  OT General Charges $OT Visit: 1 Visit OT Evaluation $OT Eval Moderate Complexity: 1 Mod G-Codes:     {Donn Wilmot, OTR/L,CBIS  [phone]  Kaitlyne Friedhoff, Charisse March 12/03/2017, 10:51 AM

## 2017-12-03 NOTE — Progress Notes (Signed)
PROGRESS NOTE    Miguel Alvarez  WJX:[id number] DOB: September 30, 1950 DOA: 12/02/2017 PCP: Melvyn Neth, NP     Brief Narrative:  67 year old man admitted from home on 6/16 due to dizziness, ataxia and horizontal nystagmus, subsequently found to have a cerebellar CVA on MRI.  Admission requested for further evaluation and management.   Assessment & Plan:   Principal Problem:   Acute CVA (cerebrovascular accident) (HCC) Active Problems:   GERD (gastroesophageal reflux disease)   Dizziness   Hyperlipidemia   Atrial fibrillation, chronic (HCC)   Acute infarcts involving the left middle cerebellar peduncle and inferior cerebellum -Suggest possibility of embolic phenomenon, he does have A. fib, but he also has marked  multivessel intracranial occlusive disease which could also be the etiology. -Has been seen in consultation by Dr. Gerilyn Pilgrim who is recommending addition of aspirin 325 mg to Eliquis with discontinuation of aspirin after 3 months. -Statin has been increased to high intensity dosing. -PT is recommending CIR, apparently they have a bed for him tomorrow. -2D echo and carotid Dopplers have been ordered, results are currently pending.  GERD -Continue PPI  Atrial fibrillation -Currently well controlled, continue Eliquis for anticoagulation  Hyperlipidemia -Continue statin, dose has been increased from 20 to 80 mg.   DVT prophylaxis: Eliquis Code Status: Full code Family Communication: Wife at bedside updated on plan of care and all questions answered Disposition Plan: CIR, likely 24 hours  Consultants:   Neurology  Procedures:   2D echo pending  Carotid Dopplers pending  Antimicrobials:  Anti-infectives (From admission, onward)   None       Subjective: In bed, still complains of diplopia and blurry vision, also occipital headache, numbness to his left face and decreased hearing out of his left ear.  Objective: Vitals:   12/03/17 0600 12/03/17 0835  12/03/17 1000 12/03/17 1400  BP: 121/70 (!) 165/69 134/61 138/60  Pulse: 69 67 61 77  Resp: 18 18 18 20   Temp: 99.2 F (37.3 C) 98.9 F (37.2 C) 98.6 F (37 C) 97.7 F (36.5 C)  TempSrc: Oral Oral Oral Oral  SpO2: 99% 99% 96% 99%  Weight:      Height:        Intake/Output Summary (Last 24 hours) at 12/03/2017 1543 Last data filed at 12/03/2017 1200 Gross per 24 hour  Intake 768.33 ml  Output 1501 ml  Net -732.67 ml   Filed Weights   12/02/17 0851 12/02/17 1215  Weight: 127 kg (280 lb) 131.9 kg (290 lb 12.6 oz)    Examination:  General exam: Alert, awake, oriented x 3, horizontal nystagmus with fast beat to the right persists today.   Respiratory system: Clear to auscultation. Respiratory effort normal. Cardiovascular system:RRR. No murmurs, rubs, gallops. Gastrointestinal system: Abdomen is nondistended, soft and nontender. No organomegaly or masses felt. Normal bowel sounds heard. Central nervous system: Alert and oriented.  Mild left-sided facial droop and numbness of left side of face Extremities: No C/C/E, +pedal pulses Skin: No rashes, lesions or ulcers Psychiatry: Judgement and insight appear normal. Mood & affect appropriate.     Data Reviewed: I have personally reviewed following labs and imaging studies  CBC: Recent Labs  Lab 12/02/17 0906  WBC 13.3*  NEUTROABS 11.4*  HGB 11.9*  HCT 36.8*  MCV 92.0  PLT 173   Basic Metabolic Panel: Recent Labs  Lab 12/02/17 0906  NA 141  K 3.8  CL 106  CO2 25  GLUCOSE 170*  BUN 21*  CREATININE 0.95  CALCIUM  9.0   GFR: Estimated Creatinine Clearance: 110.5 mL/min (by C-G formula based on SCr of 0.95 mg/dL). Liver Function Tests: No results for input(s): AST, ALT, ALKPHOS, BILITOT, PROT, ALBUMIN in the last 168 hours. No results for input(s): LIPASE, AMYLASE in the last 168 hours. No results for input(s): AMMONIA in the last 168 hours. Coagulation Profile: Recent Labs  Lab 12/02/17 0906  INR 1.25    Cardiac Enzymes: No results for input(s): CKTOTAL, CKMB, CKMBINDEX, TROPONINI in the last 168 hours. BNP (last 3 results) No results for input(s): PROBNP in the last 8760 hours. HbA1C: Recent Labs    12/03/17 0635  HGBA1C 6.0*   CBG: Recent Labs  Lab 12/02/17 0900  GLUCAP 162*   Lipid Profile: Recent Labs    12/03/17 0635  CHOL 117  HDL 38*  LDLCALC 69  TRIG 52  CHOLHDL 3.1   Thyroid Function Tests: No results for input(s): TSH, T4TOTAL, FREET4, T3FREE, THYROIDAB in the last 72 hours. Anemia Panel: No results for input(s): VITAMINB12, FOLATE, FERRITIN, TIBC, IRON, RETICCTPCT in the last 72 hours. Urine analysis: No results found for: COLORURINE, APPEARANCEUR, LABSPEC, PHURINE, GLUCOSEU, HGBUR, BILIRUBINUR, KETONESUR, PROTEINUR, UROBILINOGEN, NITRITE, LEUKOCYTESUR Sepsis Labs: @LABRCNTIP (procalcitonin:4,lacticidven:4)  )No results found for this or any previous visit (from the past 240 hour(s)).       Radiology Studies: Ct Head Wo Alvarez  Result Date: 12/02/2017 CLINICAL DATA:  Weakness, dizziness, difficulty walking. EXAM: CT HEAD WITHOUT Alvarez TECHNIQUE: Contiguous axial images were obtained from the base of the skull through the vertex without intravenous Alvarez. COMPARISON:  None. FINDINGS: Brain: Chronic microvascular disease within the deep white matter. No acute intracranial abnormality. Specifically, no hemorrhage, hydrocephalus, mass lesion, acute infarction, or significant intracranial injury. Vascular: No hyperdense vessel or unexpected calcification. Skull: No acute calvarial abnormality. Sinuses/Orbits: Visualized paranasal sinuses and mastoids clear. Orbital soft tissues unremarkable. 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 No acute intracranial abnormality. Electronically Signed   By: Charlett Nose M.D.   On: 12/02/2017 09:36   Miguel Alvarez  Result Date: 12/03/2017 CLINICAL DATA:   Dizziness, ataxia, and horizontal nystagmus. Left facial droop and numbness. Blurry/double vision. Recently diagnosed atrial fibrillation. EXAM: MRI HEAD WITHOUT Alvarez MRA HEAD WITHOUT Alvarez TECHNIQUE: Multiplanar, multiecho pulse sequences of the brain and surrounding structures were obtained without intravenous Alvarez. Angiographic images of the head were obtained using MRA technique without Alvarez. COMPARISON:  Head CT 12/02/2017 FINDINGS: MRI HEAD FINDINGS Brain: There is an acute infarct centered in the left middle cerebellar peduncle with slight extension into the dorsolateral upper medulla on the left and into the anterior left cerebellar hemisphere. No intracranial hemorrhage, mass, midline shift, or extra-axial fluid collection is identified. Multiple chronic lacunar infarcts are present in the right corona radiata and right temporoparietal periventricular white matter. The ventricles and sulci are within normal limits for age. Vascular: Major intracranial vascular flow voids are preserved. Skull and upper cervical spine: No suspicious marrow lesion. Sinuses/Orbits: Unremarkable orbits. Trace mucosal thickening in the paranasal sinuses. Small bilateral mastoid effusions. Other: None. MRA HEAD FINDINGS The visualized distal vertebral arteries are patent with the right being strongly dominant. The left vertebral artery is hypoplastic distal to the left PICA origin, which was not imaged. The right PICA origin is patent. The basilar artery is patent and small in caliber diffusely on a developmental basis with a superimposed moderate mid basilar stenosis noted. Large posterior communicating arteries are present bilaterally. The left P1 segment  is hypoplastic. The right P1 segment is not visualized and may be absent. The proximal right P2 segment appears diseased with poor visualization of the right PCA from the proximal to mid P2 level distally. The internal carotid arteries are patent from skull base  to carotid termini with mild supraclinoid stenosis noted on the right. The right MCA is occluded at its origin without evidence of significant distal reconstitution evident on this MRA. The left MCA is patent with multiple severe M1 stenoses. The ACAs are patent without evidence of significant proximal stenosis. No aneurysm is identified. IMPRESSION: 1. Acute posterior fossa infarct centered in the left middle cerebellar peduncle. 2. 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 3. Intracranial atherosclerosis including moderate basilar artery and severe left M1 stenoses. 4. Right PCA occlusion versus severely diminished flow beginning at the proximal to mid P2 level. Electronically Signed   By: Sebastian Ache M.D.   On: 12/03/2017 08:53   US Carotid Bilateral (at [facility] And [facility] Only)  Result Date: 12/03/2017 CLINICAL DATA:  Stroke, left facial droop. Coronary artery disease, previous tobacco abuse. EXAM: BILATERAL CAROTID DUPLEX ULTRASOUND TECHNIQUE: Wallace Cullens scale imaging, color Doppler and duplex ultrasound was performed of bilateral carotid and vertebral arteries in the neck. COMPARISON:  None. TECHNIQUE: Quantification of carotid stenosis is based on velocity parameters that correlate the residual internal carotid diameter with NASCET-based stenosis levels, using the diameter of the distal internal carotid lumen as the denominator for stenosis measurement. The following velocity measurements were obtained: PEAK SYSTOLIC/END DIASTOLIC RIGHT ICA:                     87/13cm/sec CCA:                     70/10cm/sec SYSTOLIC ICA/CCA RATIO:  1.2 ECA:                     107cm/sec LEFT ICA:                     97/28cm/sec CCA:                     103/19cm/sec SYSTOLIC ICA/CCA RATIO:  1.4 ECA:                     205cm/sec FINDINGS: RIGHT CAROTID ARTERY: Eccentric partially calcified plaque in the common carotid artery  and bulb extending to ICA origin, without high-grade stenosis. Normal waveforms and color Doppler signal. RIGHT VERTEBRAL ARTERY:  Normal flow direction and waveform. LEFT CAROTID ARTERY: Partially calcified plaque in the bulb and proximal ICA. No high-grade stenosis. Normal waveforms and color Doppler signal. LEFT VERTEBRAL ARTERY: Normal flow direction and waveform. IMPRESSION: 1. Mild bilateral carotid bifurcation and proximal ICA plaque, resulting in less than 50% diameter stenosis. 2.  Antegrade bilateral vertebral arterial flow. Electronically Signed   By: Corlis Leak M.D.   On: 12/03/2017 14:10   Miguel Alvarez Head/brain ZO Cm  Result Date: 12/03/2017 CLINICAL DATA:  Dizziness, ataxia, and horizontal nystagmus. Left facial droop and numbness. Blurry/double vision. Recently diagnosed atrial fibrillation. EXAM: MRI HEAD WITHOUT Alvarez MRA HEAD WITHOUT Alvarez TECHNIQUE: Multiplanar, multiecho pulse sequences of the brain and surrounding structures were obtained without intravenous Alvarez. Angiographic images of the head were obtained using MRA technique without Alvarez. COMPARISON:  Head CT 12/02/2017 FINDINGS: MRI  HEAD FINDINGS Brain: There is an acute infarct centered in the left middle cerebellar peduncle with slight extension into the dorsolateral upper medulla on the left and into the anterior left cerebellar hemisphere. No intracranial hemorrhage, mass, midline shift, or extra-axial fluid collection is identified. Multiple chronic lacunar infarcts are present in the right corona radiata and right temporoparietal periventricular white matter. The ventricles and sulci are within normal limits for age. Vascular: Major intracranial vascular flow voids are preserved. Skull and upper cervical spine: No suspicious marrow lesion. Sinuses/Orbits: Unremarkable orbits. Trace mucosal thickening in the paranasal sinuses. Small bilateral mastoid effusions. Other: None. MRA HEAD FINDINGS The visualized distal  vertebral arteries are patent with the right being strongly dominant. The left vertebral artery is hypoplastic distal to the left PICA origin, which was not imaged. The right PICA origin is patent. The basilar artery is patent and small in caliber diffusely on a developmental basis with a superimposed moderate mid basilar stenosis noted. Large posterior communicating arteries are present bilaterally. The left P1 segment is hypoplastic. The right P1 segment is not visualized and may be absent. The proximal right P2 segment appears diseased with poor visualization of the right PCA from the proximal to mid P2 level distally. The internal carotid arteries are patent from skull base to carotid termini with mild supraclinoid stenosis noted on the right. The right MCA is occluded at its origin without evidence of significant distal reconstitution evident on this MRA. The left MCA is patent with multiple severe M1 stenoses. The ACAs are patent without evidence of significant proximal stenosis. No aneurysm is identified. IMPRESSION: 1. Acute posterior fossa infarct centered in the left middle cerebellar peduncle. 2. 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 3. Intracranial atherosclerosis including moderate basilar artery and severe left M1 stenoses. 4. Right PCA occlusion versus severely diminished flow beginning at the proximal to mid P2 level. Electronically Signed   By: Sebastian AcheAllen  Grady M.D.   On: 12/03/2017 08:53        Scheduled Meds: . amLODipine  5 mg Oral Daily  . apixaban  5 mg Oral BID  . aspirin  300 mg Rectal Daily   Or  . aspirin  325 mg Oral Daily  . atorvastatin  80 mg Oral Daily  . pantoprazole  40 mg Oral Daily   Continuous Infusions: . sodium chloride 50 mL/hr at 12/02/17 1850     LOS: 1 day    Time spent: 35 minutes. Greater than 50% of this time was spent in direct contact with  the patient and with patient's wife, coordinating care and discussing relevant ongoing clinical issues, including results of MRI confirming that he has indeed suffered from a posterior circulation CVA, plans to complete medical work-up and potential discharge to CIR if beds are available on 6/18.     Chaya JanEstela Hernandez Acosta, MD [facility] Pager [phone]  If 7PM-7AM, please contact night-coverage [email or web address] Password [email or web address] 12/03/2017, 3:43 PM

## 2017-12-03 NOTE — Progress Notes (Signed)
*  PRELIMINARY RESULTS* Echocardiogram 2D Echocardiogram WITH DEFINITY has been performed.  Miguel Alvarez, Miguel Alvarez 12/03/2017, 10:49 AM

## 2017-12-03 NOTE — Consult Note (Addendum)
Miguel A. Merlene Laughter, MD     [email or web address]          Miguel Alvarez is an 67 y.o. male.   ASSESSMENT/PLAN: 1. ACUTE INFARCTS INVOLVING THE LEFT MIDDLE CEREBELLAR PEDUNCLE  AND INFERIOR CEREBELLUM:  These are multiple small infarcts with suggest possibility of embolic phenomenon but the patient does have marked multivessel intracranial occlusive disease which could also be etiology.  Risk factors hyperglycemia, hypertension, dyslipidemia and atrial fibrillation.  I recommend that the patient be placed on aspirin 325 mg in addition to the Eliquis.  The aspirin should be continued for 3 months and afterwards can be discontinued with continuation of the Eliquis.  Continue with her statin medication which appears to be working with good lipid profile at this time.  Follow-up hemoglobin A1c.  Also check for homocystine level and RPR.  2.  Possible obstructive sleep apnea syndrome as a risk factor for stroke:  Patient should have a workup for this as an outpatient.     The patient is a 67 year old white male who presents with the acute onset of acute dizziness this a couple days ago.  The patient described dizziness mostly as a lightheaded sensation and a sensation of disequilibrium and gait instability.  He did report having some spinning sensation on standing.  The wife reports that he simply could not stand and had is had difficulties moving around.  The symptoms are associated with significant vertical diplopia.  He denies any dysarthria or dysphagia.  He actually reports having numbness involving the left facial region both an inside and outside as a new finding this morning.  He denies any focal weakness.  He does report having some neck pain.  He denies headaches.  The chest pain and palpitations are not reported.  He reports being compliant with his medications.  The review of systems otherwise negative.    GENERAL:    This is a pleasant obese male who is somewhat  uncomfortable but in no acute distress.  HEENT:   He has a large neck and a large tongue.  ABDOMEN: soft  EXTREMITIES: No edema   BACK:   Normal  SKIN: Normal by inspection.    MENTAL STATUS: Alert and oriented -  Including age and the month. Speech, language and cognition are generally intact. Judgment and insight normal.   CRANIAL NERVES: Pupils are equal, round and reactive to light and accomodation;  The patient has sustained right beating nystagmus in primary position.  There is upbeat nystagmus on upgaze and the horizontal nystagmus on right gaze.  There is mild nystagmus to the left.   Extraocular movements are centrally full although movements the right seems to be ago only about a 90%. The visual fields are full; upper and lower facial muscles are normal in strength and symmetric, there is no flattening of the nasolabial folds; tongue is midline; uvula is midline; shoulder elevation is normal.  MOTOR: Normal tone, bulk and strength; no pronator drift.  COORDINATION: Left finger to nose is normal, right finger to nose is normal,   Right heel-to-shin is normal but there is subtle dysmetria on left heel to shin; No rest tremor; no intention tremor; no postural tremor; no bradykinesia.  REFLEXES: Deep tendon reflexes are symmetrical and normal.   SENSATION:   This reduced sensation involving the lower facial region on the left side.  There is no extinction to double simultaneous stimulation.    NIH stroke scale 1, 1 total 2.   Blood pressure Marland Kitchen)  165/69, pulse 67, temperature 98.9 F (37.2 C), temperature source Oral, resp. rate 18, height '6\' 2"'$  (1.88 m), weight 290 lb 12.6 oz (131.9 kg), SpO2 99 %.  Past Medical History:  Diagnosis Date  . Arthritis    osteoarthritis  . Atrial fibrillation ([place])   . GERD (gastroesophageal reflux disease)     Past Surgical History:  Procedure Laterality Date  . COLONOSCOPY  pandya   6-7 years ago, some polyps  . COLONOSCOPY WITH  PROPOFOL N/A 10/28/2015   Procedure: COLONOSCOPY WITH PROPOFOL;  Surgeon: Daneil Dolin, MD;  Location: [facility];  Service: Endoscopy;  Laterality: N/A;  0730  . ESOPHAGOGASTRODUODENOSCOPY     Barrett's esophagus per patient  . ESOPHAGOGASTRODUODENOSCOPY (EGD) WITH PROPOFOL N/A 10/28/2015   Procedure: ESOPHAGOGASTRODUODENOSCOPY (EGD) WITH PROPOFOL;  Surgeon: Daneil Dolin, MD;  Location: [facility];  Service: Endoscopy;  Laterality: N/A;  . removal of bone foot Left    age 18    Family History  Problem Relation Age of Onset  . Colon cancer Neg Hx     Social History:  reports that he quit smoking about 10 years ago. His smoking use included cigarettes. He has a 40.00 pack-year smoking history. He has never used smokeless tobacco. He reports that he drank about 7.2 oz of alcohol per week. He reports that he does not use drugs.  Allergies: No Known Allergies  Medications: Prior to Admission medications   Medication Sig Start Date End Date Taking? Authorizing Provider  amLODipine (NORVASC) 5 MG tablet Take 5 mg by mouth daily. 09/21/17  Yes [provider]  apixaban (ELIQUIS) 5 MG TABS tablet Take 5 mg by mouth 2 (two) times daily.   Yes [provider]  atorvastatin (LIPITOR) 20 MG tablet Take 20 mg by mouth daily. 09/21/17  Yes [provider]  Dexlansoprazole (DEXILANT) 30 MG capsule Take 30 mg by mouth daily.   Yes [provider]    Scheduled Meds: . amLODipine  5 mg Oral Daily  . apixaban  5 mg Oral BID  . aspirin  300 mg Rectal Daily   Or  . aspirin  325 mg Oral Daily  . atorvastatin  80 mg Oral Daily  . pantoprazole  40 mg Oral Daily   Continuous Infusions: . sodium chloride 50 mL/hr at 12/02/17 1850   PRN Meds:.acetaminophen **OR** acetaminophen (TYLENOL) oral liquid 160 mg/5 mL **OR** acetaminophen, ondansetron (ZOFRAN) IV, senna-docusate     Results for orders placed or performed during the hospital encounter of 12/02/17  (from the past 48 hour(s))  POC CBG, ED     Status: Abnormal   Collection Time: 12/02/17  9:00 AM  Result Value Ref Range   Glucose-Capillary 162 (H) 65 - 99 mg/dL  CBC with Differential     Status: Abnormal   Collection Time: 12/02/17  9:06 AM  Result Value Ref Range   WBC 13.3 (H) 4.0 - 10.5 K/uL   RBC 4.00 (L) 4.22 - 5.81 MIL/uL   Hemoglobin 11.9 (L) 13.0 - 17.0 g/dL   HCT 36.8 (L) 39.0 - 52.0 %   MCV 92.0 78.0 - 100.0 fL   MCH 29.8 26.0 - 34.0 pg   MCHC 32.3 30.0 - 36.0 g/dL   RDW 13.1 11.5 - 15.5 %   Platelets 173 150 - 400 K/uL   Neutrophils Relative % 85 %   Neutro Abs 11.4 (H) 1.7 - 7.7 K/uL   Lymphocytes Relative 6 %   Lymphs Abs  0.8 0.7 - 4.0 K/uL   Monocytes Relative 8 %   Monocytes Absolute 1.1 (H) 0.1 - 1.0 K/uL   Eosinophils Relative 1 %   Eosinophils Absolute 0.1 0.0 - 0.7 K/uL   Basophils Relative 0 %   Basophils Absolute 0.0 0.0 - 0.1 K/uL    Comment: Performed at [facility], [address] [place], [place] [address]  Basic metabolic panel     Status: Abnormal   Collection Time: 12/02/17  9:06 AM  Result Value Ref Range   Sodium 141 135 - 145 mmol/L   Potassium 3.8 3.5 - 5.1 mmol/L   Chloride 106 101 - 111 mmol/L   CO2 25 22 - 32 mmol/L   Glucose, Bld 170 (H) 65 - 99 mg/dL   BUN 21 (H) 6 - 20 mg/dL   Creatinine, Ser 0.95 0.61 - 1.24 mg/dL   Calcium 9.0 8.9 - 10.3 mg/dL   GFR calc non Af Amer >60 >60 mL/min   GFR calc Af Amer >60 >60 mL/min    Comment: (NOTE) The eGFR has been calculated using the CKD EPI equation. This calculation has not been validated in all clinical situations. eGFR's persistently <60 mL/min signify possible Chronic Kidney Disease.    Anion gap 10 5 - 15    Comment: Performed at [facility], [address], [place], [place] [address]  APTT     Status: Abnormal   Collection Time: 12/02/17  9:06 AM  Result Value Ref Range   aPTT 37 (H) 24 - 36 seconds    Comment:        IF BASELINE aPTT IS ELEVATED, SUGGEST PATIENT RISK  ASSESSMENT BE USED TO DETERMINE APPROPRIATE ANTICOAGULANT THERAPY. Performed at [facility], [address] [place], [place] [address]   Protime-INR     Status: Abnormal   Collection Time: 12/02/17  9:06 AM  Result Value Ref Range   Prothrombin Time 15.6 (H) 11.4 - 15.2 seconds   INR 1.25     Comment: Performed at [facility], [address] [place], [place] [address]  Lipid panel     Status: Abnormal   Collection Time: 12/03/17  6:35 AM  Result Value Ref Range   Cholesterol 117 0 - 200 mg/dL   Triglycerides 52 <150 mg/dL   HDL 38 (L) >40 mg/dL   Total CHOL/HDL Ratio 3.1 RATIO   VLDL 10 0 - 40 mg/dL   LDL Cholesterol 69 0 - 99 mg/dL    Comment:        Total Cholesterol/HDL:CHD Risk Coronary Heart Disease Risk Table                     Men   Women  1/2 Average Risk   3.4   3.3  Average Risk       5.0   4.4  2 X Average Risk   9.6   7.1  3 X Average Risk  23.4   11.0        Use the calculated Patient Ratio above and the CHD Risk Table to determine the patient's CHD Risk.        ATP III CLASSIFICATION (LDL):  <100     mg/dL   Optimal  100-129  mg/dL   Near or Above                    Optimal  130-159  mg/dL   Borderline  160-189  mg/dL   High  >190  mg/dL   Very High Performed at [facility], [address] [place], [place] [address]     Studies/Results:  BRAIN MRI MRA FINDINGS: MRI HEAD FINDINGS  Brain: There is an acute infarct centered in the left middle cerebellar peduncle with slight extension into the dorsolateral upper medulla on the left and into the anterior left cerebellar hemisphere. No intracranial hemorrhage, mass, midline shift, or extra-axial fluid collection is identified. Multiple chronic lacunar infarcts are present in the right corona radiata and right temporoparietal periventricular white matter. The ventricles and sulci are within normal limits for age.  Vascular: Major intracranial vascular flow voids are preserved.  Skull and  upper cervical spine: No suspicious marrow lesion.  Sinuses/Orbits: Unremarkable orbits. Trace mucosal thickening in the paranasal sinuses. Small bilateral mastoid effusions.  Other: None.  MRA HEAD FINDINGS  The visualized distal vertebral arteries are patent with the right being strongly dominant. The left vertebral artery is hypoplastic distal to the left PICA origin, which was not imaged. The right PICA origin is patent. The basilar artery is patent and small in caliber diffusely on a developmental basis with a superimposed moderate mid basilar stenosis noted. Large posterior communicating arteries are present bilaterally. The left P1 segment is hypoplastic. The right P1 segment is not visualized and may be absent. The proximal right P2 segment appears diseased with poor visualization of the right PCA from the proximal to mid P2 level distally.  The internal carotid arteries are patent from skull base to carotid termini with mild supraclinoid stenosis noted on the right. The right MCA is occluded at its origin without evidence of significant distal reconstitution evident on this MRA. The left MCA is patent with multiple severe M1 stenoses. The ACAs are patent without evidence of significant proximal stenosis. No aneurysm is identified.  IMPRESSION: 1. Acute posterior fossa infarct centered in the left middle cerebellar peduncle. 2. 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 3. Intracranial atherosclerosis including moderate basilar artery and severe left M1 stenoses. 4. Right PCA occlusion versus severely diminished flow beginning at the proximal to mid P2 level.       The brain MRI is reviewed in person acute infarcts are seen with increased signal on DWI and corresponding reduced signal on ADC involving the inferior cerebellum, middle cerebellar peduncle and  some degree the inferior cerebellar peduncle.  The epicenter however seems to be the middle cerebellar peduncle.  These are all on the left side.  These are several suggestive of a embolic phenomena.  No hemorrhage seen on SWI. Chronic lacunar type infarcts with surrounding sclerosis /increased signal on FLAIR imaging are noted involving the parasagittal region on the right frontal region and right parietal occipital region essentially a approximating watershed distribution.  MRA shows occlusion of the proximal M1 segment on the right.  There is also a mid basilar moderate stenosis an absent right PCA.  The left vertebral is hypoplastic or stenotic diffusely.  Both Pica appears to be present     Jasim Harari A. Merlene Alvarez, M.D.  Diplomate, Tax adviser of Psychiatry and Neurology ( Neurology). 12/03/2017, 9:08 AM

## 2017-12-03 NOTE — H&P (Signed)
Physical Medicine and Rehabilitation Admission H&P    Chief Complaint  Patient presents with  . Dizziness  : HPI: [name] is a 67 year old right-handed male with history of hypertension and hyperlipidemia as well as atrial fibrillation maintained on Eliquis and remote tobacco abuse.  Per chart review patient lives with spouse.  Independent prior to admission.  One level home with basement.  3 steps to entry.  Patient has a supportive family.  Presented to [facility] 12/02/2017 with dizziness, blurred vision and fall while getting up to the bathroom.  Cranial CT scan negative for acute changes.  Patient did not receive TPA.  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  Carotid Dopplers with mild bilateral carotid bifurcation and proximal ICA plaque, resulting in less than 50% diameter stenosis.  Echocardiogram with ejection fraction 65% no wall motion abnormalities.  Neurology consulted presently on aspirin for CVA prophylaxis as well as Eliquis and plan discontinuation of aspirin after 3 months.  Tolerating a regular diet.  Physical and occupational therapy evaluations completed.  Patient was admitted for a comprehensive rehab program.  Review of Systems  Constitutional: Negative for chills and fever.  HENT: Negative for hearing loss.   Eyes: Positive for blurred vision and double vision.  Respiratory: Negative for cough and shortness of breath.   Cardiovascular: Positive for palpitations and leg swelling.  Gastrointestinal: Positive for constipation. Negative for nausea and vomiting.       GERD  Genitourinary: Negative for dysuria, flank pain and  hematuria.  Musculoskeletal: Positive for myalgias.  Skin: Negative for rash.  Neurological: Positive for dizziness and focal weakness.  All other systems reviewed and are negative.  Past Medical History:  Diagnosis Date  . Arthritis    osteoarthritis  . Atrial fibrillation ([place])   . GERD (gastroesophageal reflux disease)    Past Surgical History:  Procedure Laterality Date  . COLONOSCOPY  pandya   6-7 years ago, some polyps  . COLONOSCOPY WITH PROPOFOL N/A 10/28/2015   Procedure: COLONOSCOPY WITH PROPOFOL;  Surgeon: Daneil Dolin, MD;  Location: [facility];  Service: Endoscopy;  Laterality: N/A;  0730  . ESOPHAGOGASTRODUODENOSCOPY     Barrett's esophagus per patient  . ESOPHAGOGASTRODUODENOSCOPY (EGD) WITH PROPOFOL N/A 10/28/2015   Procedure: ESOPHAGOGASTRODUODENOSCOPY (EGD) WITH PROPOFOL;  Surgeon: Daneil Dolin, MD;  Location: [facility];  Service: Endoscopy;  Laterality: N/A;  . removal of bone foot Left    age 67   Family History  Problem Relation Age of Onset  . Colon cancer Neg Hx    Social History:  reports that he quit smoking about 10 years ago. His smoking use included cigarettes. He has a 40.00 pack-year smoking history. He has never used smokeless tobacco. He reports that he drank about 7.2 oz of alcohol per week. He reports that he does not use drugs. Allergies: No Known Allergies Medications Prior to Admission  Medication Sig Dispense Refill  . amLODipine (NORVASC) 5 MG tablet Take 5 mg by mouth daily.  0  . apixaban (ELIQUIS) 5 MG TABS tablet Take 5 mg by mouth 2 (two) times daily.    Marland Kitchen atorvastatin (LIPITOR) 20 MG tablet  Take 20 mg by mouth daily.  0  . Dexlansoprazole (DEXILANT) 30 MG capsule Take 30 mg by mouth daily.      Drug Regimen Review Drug regimen was reviewed and remains appropriate with no significant issues identified  Home: Home Living Family/patient expects to be discharged to:: Private residence Living Arrangements:  Spouse/significant other Available Help at Discharge: Family Type of Home: House Home Access: Stairs to enter Technical brewer of Steps: 3 Entrance Stairs-Rails: None Home Layout: Laundry or work area in basement, Able to live on main level with bedroom/bathroom Bathroom Shower/Tub: Public librarian, Architectural technologist: Programmer, systems: Yes Home Equipment: None   Functional History: Prior Function Level of Independence: Independent  Functional Status:  Mobility: Bed Mobility Overal bed mobility: Modified Independent Transfers Overall transfer level: Needs assistance Equipment used: None, Rolling walker (2 wheeled) Transfers: Sit to/from Stand, [place] to Stand: [address], Mod assist Stand pivot transfers: Min assist, Mod assist General transfer comment: very unsteady with near fall without use of assistive device, safer using RW Ambulation/Gait Ambulation/Gait assistance: Mod assist, Min assist Gait Distance (Feet): 55 Feet Assistive device: Rolling walker (2 wheeled) Gait Pattern/deviations: Decreased step length - left, Decreased stance time - left, Decreased stride length, Drifts right/left General Gait Details: labored cadence with frequent leaning to left side especially once fatigued, c/o increasing weakness left side during gait training, mild drift to the left  Gait velocity: decreased    ADL: ADL Overall ADL's : Needs assistance/impaired Lower Body Dressing: Set up, Sitting/lateral leans Lower Body Dressing Details (indicate cue type and reason): Patient was seated to doff/donn hospital socks.  Toilet Transfer: Minimal assistance, Regular Toilet, Grab bars, RW Toileting- Clothing Manipulation and Hygiene: Minimal assistance, Sit to/from stand, Cueing for safety Functional mobility during ADLs: Moderate assistance, Rolling walker General ADL Comments: During ambulation, PT provided physical assistance which appeared to  vary from Min-Mod or Max depending on patient's frequent loss of balance towards the left. OT provided SBA assistance on right side to cue patient to keep walker wheels on ground.   Cognition: Cognition Overall Cognitive Status: Within Functional Limits for tasks assessed Orientation Level: Oriented X4 Cognition Arousal/Alertness: Awake/alert Behavior During Therapy: WFL for tasks assessed/performed Overall Cognitive Status: Within Functional Limits for tasks assessed  Physical Exam: Blood pressure 138/60, pulse 77, temperature 97.7 F (36.5 C), temperature source Oral, resp. rate 20, height _0  (1.88 m), weight 131.9 kg (290 lb 12.6 oz), SpO2 99 %. Physical Exam  Constitutional: He appears well-developed.  HENT:  Head: Normocephalic.  Eyes: Pupils are equal, round, and reactive to light.  Neck: Normal range of motion.  Cardiovascular: Normal rate.  Respiratory: Effort normal.  GI: Soft.  Neurological: He is alert.  Patient is alert.  Speech is fluent.  Follows simple commands. Horizontal nystagmus with left and right gaze. Mild left-sided facial sensory loss. Mild dysmetria LUE and LLE.   Skin: Skin is warm.  Psychiatric:  Cooperative, flat affect    Results for orders placed or performed during the hospital encounter of 12/02/17 (from the past 48 hour(s))  POC CBG, ED     Status: Abnormal   Collection Time: 12/02/17  9:00 AM  Result Value Ref Range   Glucose-Capillary 162 (H) 65 - 99 mg/dL  CBC with Differential     Status: Abnormal   Collection Time: 12/02/17  9:06 AM  Result Value Ref Range   WBC 13.3 (H) 4.0 - 10.5 K/uL   RBC 4.00 (L)  4.22 - 5.81 MIL/uL   Hemoglobin 11.9 (L) 13.0 - 17.0 g/dL   HCT 36.8 (L) 39.0 - 52.0 %   MCV 92.0 78.0 - 100.0 fL   MCH 29.8 26.0 - 34.0 pg   MCHC 32.3 30.0 - 36.0 g/dL   RDW 13.1 11.5 - 15.5 %   Platelets 173 150 - 400 K/uL   Neutrophils Relative % 85 %   Neutro Abs 11.4 (H) 1.7 - 7.7 K/uL   Lymphocytes Relative 6 %   Lymphs Abs  0.8 0.7 - 4.0 K/uL   Monocytes Relative 8 %   Monocytes Absolute 1.1 (H) 0.1 - 1.0 K/uL   Eosinophils Relative 1 %   Eosinophils Absolute 0.1 0.0 - 0.7 K/uL   Basophils Relative 0 %   Basophils Absolute 0.0 0.0 - 0.1 K/uL    Comment: Performed at [facility], [address] [place], [place] [address]  Basic metabolic panel     Status: Abnormal   Collection Time: 12/02/17  9:06 AM  Result Value Ref Range   Sodium 141 135 - 145 mmol/L   Potassium 3.8 3.5 - 5.1 mmol/L   Chloride 106 101 - 111 mmol/L   CO2 25 22 - 32 mmol/L   Glucose, Bld 170 (H) 65 - 99 mg/dL   BUN 21 (H) 6 - 20 mg/dL   Creatinine, Ser 0.95 0.61 - 1.24 mg/dL   Calcium 9.0 8.9 - 10.3 mg/dL   GFR calc non Af Amer >60 >60 mL/min   GFR calc Af Amer >60 >60 mL/min    Comment: (NOTE) The eGFR has been calculated using the CKD EPI equation. This calculation has not been validated in all clinical situations. eGFR's persistently <60 mL/min signify possible Chronic Kidney Disease.    Anion gap 10 5 - 15    Comment: Performed at [facility], [address], [place], [place] [address]  APTT     Status: Abnormal   Collection Time: 12/02/17  9:06 AM  Result Value Ref Range   aPTT 37 (H) 24 - 36 seconds    Comment:        IF BASELINE aPTT IS ELEVATED, SUGGEST PATIENT RISK ASSESSMENT BE USED TO DETERMINE APPROPRIATE ANTICOAGULANT THERAPY. Performed at [facility], [address], [place], [place] [address]   Protime-INR     Status: Abnormal   Collection Time: 12/02/17  9:06 AM  Result Value Ref Range   Prothrombin Time 15.6 (H) 11.4 - 15.2 seconds   INR 1.25     Comment: Performed at [facility], [address], [place], [place] [address]  Hemoglobin A1c     Status: Abnormal   Collection Time: 12/03/17  6:35 AM  Result Value Ref Range   Hgb A1c MFr Bld 6.0 (H) 4.8 - 5.6 %    Comment: (NOTE) Pre diabetes:          5.7%-6.4% Diabetes:              >6.4% Glycemic control for   <7.0% adults with diabetes    Mean  Plasma Glucose 125.5 mg/dL    Comment: Performed at [facility] [address] [place], [place] [address]  Lipid panel     Status: Abnormal   Collection Time: 12/03/17  6:35 AM  Result Value Ref Range   Cholesterol 117 0 - 200 mg/dL   Triglycerides 52 <150 mg/dL   HDL 38 (L) >40 mg/dL   Total CHOL/HDL Ratio 3.1 RATIO   VLDL 10 0 -  40 mg/dL   LDL Cholesterol 69 0 - 99 mg/dL    Comment:        Total Cholesterol/HDL:CHD Risk Coronary Heart Disease Risk Table                     Men   Women  1/2 Average Risk   3.4   3.3  Average Risk       5.0   4.4  2 X Average Risk   9.6   7.1  3 X Average Risk  23.4   11.0        Use the calculated Patient Ratio above and the CHD Risk Table to determine the patient's CHD Risk.        ATP III CLASSIFICATION (LDL):  <100     mg/dL   Optimal  100-129  mg/dL   Near or Above                    Optimal  130-159  mg/dL   Borderline  160-189  mg/dL   High  >190     mg/dL   Very High Performed at [facility] [place], [place] [address]    Ct Head Wo Contrast  Result Date: 12/02/2017 CLINICAL DATA:  Weakness, dizziness, difficulty walking. EXAM: CT HEAD WITHOUT CONTRAST TECHNIQUE: Contiguous axial images were obtained from the base of the skull through the vertex without intravenous contrast. COMPARISON:  None. FINDINGS: Brain: Chronic microvascular disease within the deep white matter. No acute intracranial abnormality. Specifically, no hemorrhage, hydrocephalus, mass lesion, acute infarction, or significant intracranial injury. Vascular: No hyperdense vessel or unexpected calcification. Skull: No acute calvarial abnormality. Sinuses/Orbits: Visualized paranasal sinuses and mastoids clear. Orbital soft tissues unremarkable. 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 No acute intracranial abnormality. Electronically Signed   By: Rolm Baptise M.D.   On: 12/02/2017 09:36   Mr Brain Wo  Contrast  Result Date: 12/03/2017 CLINICAL DATA:  Dizziness, ataxia, and horizontal nystagmus. Left facial droop and numbness. Blurry/double vision. Recently diagnosed atrial fibrillation. EXAM: MRI HEAD WITHOUT CONTRAST MRA HEAD WITHOUT CONTRAST TECHNIQUE: Multiplanar, multiecho pulse sequences of the brain and surrounding structures were obtained without intravenous contrast. Angiographic images of the head were obtained using MRA technique without contrast. COMPARISON:  Head CT 12/02/2017 FINDINGS: MRI HEAD FINDINGS Brain: There is an acute infarct centered in the left middle cerebellar peduncle with slight extension into the dorsolateral upper medulla on the left and into the anterior left cerebellar hemisphere. No intracranial hemorrhage, mass, midline shift, or extra-axial fluid collection is identified. Multiple chronic lacunar infarcts are present in the right corona radiata and right temporoparietal periventricular white matter. The ventricles and sulci are within normal limits for age. Vascular: Major intracranial vascular flow voids are preserved. Skull and upper cervical spine: No suspicious marrow lesion. Sinuses/Orbits: Unremarkable orbits. Trace mucosal thickening in the paranasal sinuses. Small bilateral mastoid effusions. Other: None. MRA HEAD FINDINGS The visualized distal vertebral arteries are patent with the right being strongly dominant. The left vertebral artery is hypoplastic distal to the left PICA origin, which was not imaged. The right PICA origin is patent. The basilar artery is patent and small in caliber diffusely on a developmental basis with a superimposed moderate mid basilar stenosis noted. Large posterior communicating arteries are present bilaterally. The left P1 segment is hypoplastic. The right P1 segment is not visualized and may be absent. The proximal right P2 segment  appears diseased with poor visualization of the right PCA from the proximal to mid P2 level distally. The  internal carotid arteries are patent from skull base to carotid termini with mild supraclinoid stenosis noted on the right. The right MCA is occluded at its origin without evidence of significant distal reconstitution evident on this MRA. The left MCA is patent with multiple severe M1 stenoses. The ACAs are patent without evidence of significant proximal stenosis. No aneurysm is identified. IMPRESSION: 1. Acute posterior fossa infarct centered in the left middle cerebellar peduncle. 2. 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 3. Intracranial atherosclerosis including moderate basilar artery and severe left M1 stenoses. 4. Right PCA occlusion versus severely diminished flow beginning at the proximal to mid P2 level. Electronically Signed   By: Logan Bores M.D.   On: 12/03/2017 08:53   Mr Jodene Nam Head/brain WE Cm  Result Date: 12/03/2017 CLINICAL DATA:  Dizziness, ataxia, and horizontal nystagmus. Left facial droop and numbness. Blurry/double vision. Recently diagnosed atrial fibrillation. EXAM: MRI HEAD WITHOUT CONTRAST MRA HEAD WITHOUT CONTRAST TECHNIQUE: Multiplanar, multiecho pulse sequences of the brain and surrounding structures were obtained without intravenous contrast. Angiographic images of the head were obtained using MRA technique without contrast. COMPARISON:  Head CT 12/02/2017 FINDINGS: MRI HEAD FINDINGS Brain: There is an acute infarct centered in the left middle cerebellar peduncle with slight extension into the dorsolateral upper medulla on the left and into the anterior left cerebellar hemisphere. No intracranial hemorrhage, mass, midline shift, or extra-axial fluid collection is identified. Multiple chronic lacunar infarcts are present in the right corona radiata and right temporoparietal periventricular white matter. The ventricles and sulci are within normal limits for age. Vascular: Major  intracranial vascular flow voids are preserved. Skull and upper cervical spine: No suspicious marrow lesion. Sinuses/Orbits: Unremarkable orbits. Trace mucosal thickening in the paranasal sinuses. Small bilateral mastoid effusions. Other: None. MRA HEAD FINDINGS The visualized distal vertebral arteries are patent with the right being strongly dominant. The left vertebral artery is hypoplastic distal to the left PICA origin, which was not imaged. The right PICA origin is patent. The basilar artery is patent and small in caliber diffusely on a developmental basis with a superimposed moderate mid basilar stenosis noted. Large posterior communicating arteries are present bilaterally. The left P1 segment is hypoplastic. The right P1 segment is not visualized and may be absent. The proximal right P2 segment appears diseased with poor visualization of the right PCA from the proximal to mid P2 level distally. The internal carotid arteries are patent from skull base to carotid termini with mild supraclinoid stenosis noted on the right. The right MCA is occluded at its origin without evidence of significant distal reconstitution evident on this MRA. The left MCA is patent with multiple severe M1 stenoses. The ACAs are patent without evidence of significant proximal stenosis. No aneurysm is identified. IMPRESSION: 1. Acute posterior fossa infarct centered in the left middle cerebellar peduncle. 2. 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 3. Intracranial atherosclerosis including moderate basilar artery and severe left M1 stenoses. 4. Right PCA occlusion versus severely diminished flow beginning at the proximal to mid P2 level. Electronically Signed   By: Logan Bores M.D.   On: 12/03/2017 08:53       Medical Problem List and Plan: 1.  Decreased functional mobility secondary to acute posterior fossa infarct  centered in the left middle cerebellar peduncle  -admit to inpatient rehab 2.  DVT Prophylaxis/Anticoagulation: Eliquis.  Monitor for any bleeding episodes 3. Pain Management: Tylenol as needed 4. Mood: Provide emotional support 5. Neuropsych: This patient is capable of making decisions on his own behalf. 6. Skin/Wound Care: Routine skin checks 7. Fluids/Electrolytes/Nutrition: Routine in and outs with follow-up chemistries 8.  Hypertension.  Norvasc 5 mg daily.  Monitor with increased mobility 9.  Atrial fibrillation.  Cardiac rate controlled.  Continue Eliquis 10.  Hyperlipidemia.  Lipitor 11.  GERD.  Protonix   Post Admission Physician Evaluation: 1. Functional deficits secondary  to left cerebellar peduncle infarct. 2. Patient is admitted to receive collaborative, interdisciplinary care between the physiatrist, rehab nursing staff, and therapy team. 3. Patient's level of medical complexity and substantial therapy needs in context of that medical necessity cannot be provided at a lesser intensity of care such as a SNF. 4. Patient has experienced substantial functional loss from his/her baseline which was documented above under the "Functional History" and "Functional Status" headings.  Judging by the patient's diagnosis, physical exam, and functional history, the patient has potential for functional progress which will result in measurable gains while on inpatient rehab.  These gains will be of substantial and practical use upon discharge  in facilitating mobility and self-care at the household level. 5. Physiatrist will provide 24 hour management of medical needs as well as oversight of the therapy plan/treatment and provide guidance as appropriate regarding the interaction of the two. 6. The Preadmission Screening has been reviewed and patient status is unchanged unless otherwise stated above. 7. 24 hour rehab nursing will assist with bladder management, bowel management, safety, skin/wound  care, disease management, medication administration and patient education  and help integrate therapy concepts, techniques,education, etc. 8. PT will assess and treat for/with: Lower extremity strength, range of motion, stamina, balance, functional mobility, safety, adaptive techniques and equipment, vestibular rx, family ed.   Goals are: supervision. 9. OT will assess and treat for/with: ADL's, functional mobility, safety, upper extremity strength, adaptive techniques and equipment, NMR, family ed, vestibular rx.   Goals are: supervision. Therapy may proceed with showering this patient. 10. SLP will assess and treat for/with: n/a.  Goals are: n/a. 11. Case Management and Social Worker will assess and treat for psychological issues and discharge planning. 12. Team conference will be held weekly to assess progress toward goals and to determine barriers to discharge. 13. Patient will receive at least 3 hours of therapy per day at least 5 days per week. 14. ELOS: 10-14 days       15. Prognosis:  excellent   I have personally performed a face to face diagnostic evaluation of this patient and formulated the key components of the plan.  Additionally, I have personally reviewed laboratory data, imaging studies, as well as relevant notes and concur with the physician assistant's documentation above.  Meredith Staggers, MD, FAAPMR     Lavon Paganini [place], PA-C 12/03/2017

## 2017-12-03 NOTE — PMR Pre-admission (Signed)
[facility] PMR Admission Coordinator Pre-Admission Assessment  Patient: Miguel Alvarez is an 67 y.o., male MRN: [id number] DOB: Jan 06, 1951 Height: 6\' 2"  (188 cm) Weight: 131.9 kg (290 lb 12.6 oz)  Insurance Information HMO: No   PPO:       PCP:       IPA:       80/20:       OTHER:   PRIMARY:  Medicare A and B      Policy#: [id number]      Subscriber:  patient CM Name:        Phone#:       Fax#:   Pre-Cert#:        Employer:   Benefits:  Phone #:       Name: Checked in Passport One Source Eff. Date: 02/18/16     Deduct:  $1364      Out of Pocket Max:  None      Life Max: N/A CIR: 100%      SNF: 100 days Outpatient: 80%     Co-Pay: 20% Home Health: 100%      Co-Pay: none DME: 80%     Co-Pay: 20% Providers: patient's choice  SECONDARY:  TEFL teacher Supplement      Policy#: [id number]      Subscriber:  patient CM Name:        Phone#:       Fax#:   Pre-Cert#:        Employer:   Benefits:  Phone #:       Name:   Eff. Date: 02/18/16      Deduct:        Out of Pocket Max:        Life Max:   CIR:        SNF:   Outpatient:       Co-Pay:   Home Health:        Co-Pay:   DME:       Co-Pay:    Emergency Contact Information Contact Information    Name Relation Home Work Mobile   Jaros,[name] Spouse [phone]  [phone]      Current Medical History  Patient Admitting Diagnosis: L cerebellar CVA  History of Present Illness: A 67 year old right-handed male with history of hypertension and hyperlipidemia as well as atrial fibrillation maintained on Eliquis and remote tobacco abuse.  Per chart review patient lives with spouse.  Independent prior to admission.  One level home with basement.  3 steps to entry.  Patient has a supportive family.  [facility]ed [facility]/2017 with dizziness, blurred vision and fall while getting up to the bathroom.  Cranial CT scan negative for acute changes.  Patient did not receive TPA.  MRI showed acute posterior fossa infarct  centered in the left middle cerebellar peduncle.  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  Carotid Dopplers with mild bilateral carotid bifurcation and proximal ICA plaque, resulting in less than 50% diameter stenosis.  Echocardiogram with ejection fraction 65% no wall motion abnormalities.  Neurology consulted presently on aspirin for CVA prophylaxis as well as Eliquis.  Tolerating a regular diet.  Physical and occupational therapy evaluations completed. Patient was admitted for a comprehensive rehab program.  Patient[facility]  [facility] has been reviewed by the rehabilitation admission coordinator and physician.  NIH Stroke scale: 5  Past Medical History  Past Medical History:  Diagnosis Date  . Arthritis    osteoarthritis  . Atrial fibrillation (HCC)   . GERD (gastroesophageal reflux disease)     Family History   family history is not on file.  Prior Rehab/Hospitalizations Has the patient had major surgery during 100 days prior to admission? No    Current Medications [facility]  Patients Current Diet:   Heart healthy, thin liquids  Precautions / Restrictions Precautions Precautions: Fall Restrictions Weight Bearing Restrictions: No   Has the patient had 2 or more falls or a fall with injury in the past year?No  Prior Activity Level Community (5-7x/wk): Went out daily, was driving.  Prior Functional Level Self Care: Did the patient need help bathing, dressing, using the toilet or eating?  Independent  Indoor Mobility: Did the patient need assistance with walking from room to room (with or without device)? Independent  Stairs: Did the patient need assistance with internal or external stairs  (with or without device)? Independent  Functional Cognition: Did the patient need help planning regular tasks such as shopping or remembering to take medications? Independent  Home Assistive Devices / Equipment Home Assistive Devices/Equipment: None Home Equipment: None  Prior Device Use: Indicate devices/aids used by the patient prior to current illness, exacerbation or injury? None   Prior Functional Level Current Functional Level  Bed Mobility  Independent  Mod Independent   Transfers  Independent  Mod assist(Min to mod assist for transfers.)   Mobility - Walk/Wheelchair  Independent  Mod assist(Ambulated min to mod assist 55 feet RW)   Upper Body Dressing  Independent      Lower Body Dressing  Independent  Other(Set up to donn socks)   Grooming  Independent      Eating/Drinking  Independent      Toilet Transfer  Independent  Min assist   Bladder Continence   WDL  Voiding in urinal   Bowel Management  WDL  Last BM 12/02/17   Stair Climbing  Independent Other(Not tried)   Tax inspectorCommunication  Verbal  Verbal   Memory  WDL  WDL   Cooking/Meal Prep  Independent      Housework  Independent    Money Management  Independent    Driving  Independent     Special needs/care consideration BiPAP/CPAP No CPM No Continuous Drip IV 0.9% NS 50 mL/hr Dialysis No       Life Vest No Oxygen No Special Bed No Trach Size No Wound Vac (area) No       Skin Bruises easily                           Bowel mgmt: Last BM 12/03/17 Bladder mgmt: Voiding in urinal Diabetic mgmt No  Previous Home Environment Living Arrangements: Spouse/significant other Available Help at Discharge: Family Type of Home: House Home Layout: Laundry or work area in basement, Able to live on main level with bedroom/bathroom Home Access: Stairs to enter Entrance Stairs-Rails: None Secretary/administratorntrance Stairs-Number of Steps: 3 Bathroom Shower/Tub: Hydrographic surveyorTub/shower unit, Engineer, building servicesCurtain Bathroom Toilet:  Pharmacist, communitytandard Bathroom Accessibility: Yes Home Care Services: No  Discharge Living Setting Plans for Discharge Living Setting: House, Lives with (comment)(Lives with wife.) Type of Home at Discharge: House Discharge Home Layout: Two level, Laundry or work area in basement, Able to live on  main level with bedroom/bathroom Alternate Level Stairs-Number of Steps: Flight Discharge Home Access: Stairs to enter Entrance Stairs-Rails: None Entrance Stairs-Number of Steps: 3 steps Does the patient have any problems obtaining your medications?: No  Social/Family/Support Systems Patient Roles: Spouse(Has a wife.) Contact Information: Dickey Caamano - wife Anticipated Caregiver: Wife Anticipated Caregiver's Contact Information: Wille Celeste - wife - [phone] Ability/Limitations of Caregiver: Wife does not work and can assist. Medical laboratory scientific officer: 24/7 Discharge Plan Discussed with Primary Caregiver: Yes Is Caregiver In Agreement with Plan?: Yes Does Caregiver/Family have Issues with Lodging/Transportation while Pt is in Rehab?: No  Goals/Additional Needs Patient/Family Goal for Rehab: PT/OT supervision goals Expected length of stay: 10-14 days Cultural Considerations: None Dietary Needs: Heart healthy, thin liquids Equipment Needs: TBD Pt/Family Agrees to Admission and willing to participate: Yes Program Orientation Provided & Reviewed with Pt/Caregiver Including Roles  & Responsibilities: Yes Additional Information Needs:       Patient Condition: I have reviewed all clinical records.  I have spoken with patient and his wife by phone.  Patient can tolerate and will benefit from 3 hours of therapy a day to regain his functional independence.  He needs the coordinated approach of the rehab team to achieve the above goal.  I have shared all information with rehab MD and I have approval for acute inpatient rehab admission.  Preadmission Screen Completed By:  Trish Mage, 12/04/2017 10:34  AM ______________________________________________________________________   Discussed status with Dr. Riley Kill on 12/04/17 at 1036 and received telephone approval for admission today.  Admission Coordinator:  Trish Mage, time 1036/Date 12/04/17   Assessment/Plan: Diagnosis: left cerebellar CVA 1. Does the need for close, 24 hr/day  Medical supervision in concert with the patient's rehab needs make it unreasonable for this patient to be served in a less intensive setting? Yes 2. Co-Morbidities requiring supervision/potential complications: a fib, htn, gerd, post-stroke sequelae 3. Due to bladder management, bowel management, safety, skin/wound care, disease management, medication administration, pain management and patient education, does the patient require 24 hr/day rehab nursing? Yes 4. Does the patient require coordinated care of a physician, rehab nurse, PT (1-2 hrs/day, 5 days/week) and OT (1-2 hrs/day, 5 days/week) to address physical and functional deficits in the context of the above medical diagnosis(es)? Yes Addressing deficits in the following areas: balance, endurance, locomotion, strength, transferring, bowel/bladder control, bathing, dressing, feeding, grooming, toileting and psychosocial support 5. Can the patient actively participate in an intensive therapy program of at least 3 hrs of therapy 5 days a week? Yes 6. The potential for patient to make measurable gains while on inpatient rehab is excellent 7. Anticipated functional outcomes upon discharge from inpatients are: modified independent and supervision PT, modified independent and supervision OT, n/a SLP 8. Estimated rehab length of stay to reach the above functional goals is: 10-14 days 9. Does the patient have adequate social supports to accommodate these discharge functional goals? Yes 10. Anticipated D/C setting: Home 11. Anticipated post D/C treatments: HH therapy and Outpatient therapy 12. Overall Rehab/Functional  Prognosis: excellent    RECOMMENDATIONS: This patient's condition is appropriate for continued rehabilitative care in the following setting: CIR Patient has agreed to participate in recommended program. Yes Note that insurance prior authorization may be required for reimbursement for recommended care.  Comment: Admit to inpatient rehab today  Ranelle Oyster, MD, [facility] Physical Medicine & Rehabilitation 12/04/2017   Trish Mage 12/04/2017

## 2017-12-03 NOTE — Progress Notes (Signed)
Pt informed me that he mentioned to Neurologist that he was "experiencing numbness on left side of face and back of head" However, numbness is not acute and both Neurologist and MD is aware. Will continue to monitor patient.

## 2017-12-03 NOTE — Plan of Care (Signed)
  Problem: Acute Rehab PT Goals(only PT should resolve) Goal: Patient Will Transfer Sit To/From Stand Outcome: Progressing Flowsheets (Taken 12/03/2017 1156) Patient will transfer sit to/from stand: with min guard assist Goal: Pt Will Transfer Bed To Chair/Chair To Bed Outcome: Progressing Flowsheets (Taken 12/03/2017 1156) Pt will Transfer Bed to Chair/Chair to Bed: min guard assist Goal: Pt Will Ambulate Outcome: Progressing Flowsheets (Taken 12/03/2017 1156) Pt will Ambulate: 100 feet;with min guard assist;with rolling walker   11:57 AM, 12/03/17 Ocie BobJames Madgie Dhaliwal, MPT Physical Therapist with [facility] [phone] office [phone] mobile phone

## 2017-12-03 NOTE — Plan of Care (Signed)
  Problem: Acute Rehab OT Goals (only OT should resolve) Goal: Pt. Will Perform Grooming Flowsheets (Taken 12/03/2017 1102) Pt Will Perform Grooming: with supervision;standing Goal: Pt. Will Transfer To Toilet Flowsheets (Taken 12/03/2017 1102) Pt Will Transfer to Toilet: with supervision;ambulating;regular height toilet;grab bars Note:  With RW Goal: Pt. Will Perform Toileting-Clothing Manipulation Flowsheets (Taken 12/03/2017 1102) Pt Will Perform Toileting - Clothing Manipulation and hygiene: with modified independence;sitting/lateral leans;sit to/from stand Goal: Pt. Will Perform Tub/Shower Transfer Flowsheets (Taken 12/03/2017 1102) Pt Will Perform Tub/Shower Transfer: Tub transfer;with min guard assist;ambulating;shower seat;rolling walker Note:  With therapist providing a simulated tub/shower height to step over into.

## 2017-12-03 NOTE — Care Management (Signed)
Pt being recommended for CIR. CM discussed CIR vs SNF with pt and wife at bedside. Pt and wife very interested in CIR. Pt has strong support at DC. CM discussed with Genie (CIR rep) who will reach out to pt/wife later today.

## 2017-12-03 NOTE — Progress Notes (Signed)
Rehab Admissions Coordinator Note:  Patient was screened by Trish MageLogue, Kla Bily M for appropriateness for an Inpatient Acute Rehab Consult.  At this time, PT/OT/SLP evaluations are pending.  Once evaluations are completed, then we can see if inpatient rehab admission might be appropriate for patient.  Call me for questions.  Trish MageLogue, Donyale Falcon M 12/03/2017, 10:32 AM  I can be reached at [phone].

## 2017-12-03 NOTE — Progress Notes (Signed)
SLP Cancellation Note  Patient Details Name: Brita Romphomas Roseboom MRN: [id number] DOB: 07/16/1950   Cancelled treatment:          SLE cx-Pt out of room for procedure.  Will follow up tomorrow.  Athena MasseAngela Viraaj Vorndran  M.A., CCC-SLP [email or web address]    Dorena Bodongela W Haruna Rohlfs 12/03/2017, 12:17 PM

## 2017-12-04 ENCOUNTER — Inpatient Hospital Stay (HOSPITAL_COMMUNITY)
Admission: RE | Admit: 2017-12-04 | Discharge: 2017-12-12 | DRG: 057 | Disposition: A | Discharge status: Home / Self Care | Payer: Medicare Other | Source: Intra-hospital | Attending: Physical Medicine & Rehabilitation | Admitting: Physical Medicine & Rehabilitation

## 2017-12-04 ENCOUNTER — Encounter (HOSPITAL_COMMUNITY): Payer: Self-pay

## 2017-12-04 ENCOUNTER — Other Ambulatory Visit: Payer: Self-pay

## 2017-12-04 DIAGNOSIS — I63512 Cerebral infarction due to unspecified occlusion or stenosis of left middle cerebral artery: Secondary | ICD-10-CM | POA: Diagnosis not present

## 2017-12-04 DIAGNOSIS — Z87891 Personal history of nicotine dependence: Secondary | ICD-10-CM | POA: Diagnosis not present

## 2017-12-04 DIAGNOSIS — H538 Other visual disturbances: Secondary | ICD-10-CM | POA: Diagnosis present

## 2017-12-04 DIAGNOSIS — R278 Other lack of coordination: Secondary | ICD-10-CM | POA: Diagnosis present

## 2017-12-04 DIAGNOSIS — E785 Hyperlipidemia, unspecified: Secondary | ICD-10-CM | POA: Diagnosis present

## 2017-12-04 DIAGNOSIS — I4891 Unspecified atrial fibrillation: Secondary | ICD-10-CM | POA: Diagnosis present

## 2017-12-04 DIAGNOSIS — I69398 Other sequelae of cerebral infarction: Secondary | ICD-10-CM | POA: Diagnosis present

## 2017-12-04 DIAGNOSIS — K219 Gastro-esophageal reflux disease without esophagitis: Secondary | ICD-10-CM | POA: Diagnosis present

## 2017-12-04 DIAGNOSIS — I480 Paroxysmal atrial fibrillation: Secondary | ICD-10-CM | POA: Diagnosis not present

## 2017-12-04 DIAGNOSIS — I639 Cerebral infarction, unspecified: Secondary | ICD-10-CM

## 2017-12-04 DIAGNOSIS — Z9181 History of falling: Secondary | ICD-10-CM | POA: Diagnosis not present

## 2017-12-04 DIAGNOSIS — M1990 Unspecified osteoarthritis, unspecified site: Secondary | ICD-10-CM | POA: Diagnosis present

## 2017-12-04 DIAGNOSIS — I10 Essential (primary) hypertension: Secondary | ICD-10-CM | POA: Diagnosis present

## 2017-12-04 DIAGNOSIS — H532 Diplopia: Secondary | ICD-10-CM | POA: Diagnosis present

## 2017-12-04 DIAGNOSIS — Z7901 Long term (current) use of anticoagulants: Secondary | ICD-10-CM

## 2017-12-04 DIAGNOSIS — M549 Dorsalgia, unspecified: Secondary | ICD-10-CM | POA: Diagnosis present

## 2017-12-04 DIAGNOSIS — I482 Chronic atrial fibrillation, unspecified: Secondary | ICD-10-CM | POA: Diagnosis present

## 2017-12-04 DIAGNOSIS — I69393 Ataxia following cerebral infarction: Secondary | ICD-10-CM | POA: Diagnosis not present

## 2017-12-04 DIAGNOSIS — I6601 Occlusion and stenosis of right middle cerebral artery: Secondary | ICD-10-CM | POA: Diagnosis present

## 2017-12-04 LAB — RPR: RPR Ser Ql: NONREACTIVE

## 2017-12-04 LAB — HIV ANTIBODY (ROUTINE TESTING W REFLEX): HIV Screen 4th Generation wRfx: NONREACTIVE

## 2017-12-04 LAB — HOMOCYSTEINE: Homocysteine: 13.2 umol/L (ref 0.0–15.0)

## 2017-12-04 MED ORDER — SENNOSIDES-DOCUSATE SODIUM 8.6-50 MG PO TABS
1.0000 | ORAL_TABLET | Freq: Every evening | ORAL | Status: DC | PRN
  Administered 2017-12-10: 1 via ORAL
  Filled 2017-12-04: qty 1

## 2017-12-04 MED ORDER — ONDANSETRON HCL 4 MG/2ML IJ SOLN: 4.0000 mg | Freq: Four times a day (QID) | INTRAMUSCULAR | Status: DC | PRN

## 2017-12-04 MED ORDER — APIXABAN 5 MG PO TABS
5.0000 mg | ORAL_TABLET | Freq: Two times a day (BID) | ORAL | Status: DC
  Administered 2017-12-04 – 2017-12-12 (×16): 5 mg via ORAL
  Filled 2017-12-04 (×16): qty 1

## 2017-12-04 MED ORDER — ATORVASTATIN CALCIUM 80 MG PO TABS
80.0000 mg | ORAL_TABLET | Freq: Every day | ORAL | Status: DC
  Administered 2017-12-05 – 2017-12-12 (×8): 80 mg via ORAL
  Filled 2017-12-04 (×8): qty 1

## 2017-12-04 MED ORDER — ASPIRIN 325 MG PO TABS
325.0000 mg | ORAL_TABLET | Freq: Every day | ORAL | Status: DC
  Administered 2017-12-05 – 2017-12-12 (×8): 325 mg via ORAL
  Filled 2017-12-04 (×8): qty 1

## 2017-12-04 MED ORDER — ACETAMINOPHEN 325 MG PO TABS
650.0000 mg | ORAL_TABLET | ORAL | Status: DC | PRN
  Administered 2017-12-04 – 2017-12-05 (×5): 650 mg via ORAL
  Filled 2017-12-04 (×5): qty 2

## 2017-12-04 MED ORDER — ACETAMINOPHEN 160 MG/5ML PO SOLN: 650.0000 mg | ORAL | Status: DC | PRN

## 2017-12-04 MED ORDER — ASPIRIN 300 MG RE SUPP
300.0000 mg | Freq: Every day | RECTAL | Status: DC
  Filled 2017-12-04 (×3): qty 1

## 2017-12-04 MED ORDER — ONDANSETRON HCL 4 MG PO TABS: 4.0000 mg | ORAL_TABLET | Freq: Four times a day (QID) | ORAL | Status: DC | PRN

## 2017-12-04 MED ORDER — AMLODIPINE BESYLATE 5 MG PO TABS
5.0000 mg | ORAL_TABLET | Freq: Every day | ORAL | Status: DC
  Administered 2017-12-05 – 2017-12-12 (×8): 5 mg via ORAL
  Filled 2017-12-04 (×8): qty 1

## 2017-12-04 MED ORDER — ACETAMINOPHEN 650 MG RE SUPP: 650.0000 mg | RECTAL | Status: DC | PRN

## 2017-12-04 MED ORDER — PANTOPRAZOLE SODIUM 40 MG PO TBEC
40.0000 mg | DELAYED_RELEASE_TABLET | Freq: Every day | ORAL | Status: DC
  Administered 2017-12-05 – 2017-12-12 (×8): 40 mg via ORAL
  Filled 2017-12-04 (×8): qty 1

## 2017-12-04 NOTE — Progress Notes (Addendum)
Physical Therapy Treatment Patient Details Name: Miguel Alvarez MRN: [id number] DOB: 11/21/1950 Today's Date: 12/04/2017    History of Present Illness Miguel Romphomas Kranz is a [age] y.o. male with history of hypertension and hyperlipidemia who comes into the hospital today because of dizziness.  He states he went to bed last night around 9 PM and was in his usual state of health.  This morning he woke up at around 3 AM to go to the bathroom and fell out of bed, has been severely off balance and with severe dizziness.  He states that he has not been very coordinated since.  He has been having difficulty ambulating and notices that he falls to the left side.  He also notes blurry vision and double vision.  He also states that the left side of his face is numb and he is having difficulty hearing out of his left ear.  In the ED vital signs are noted to be stable, slightly hypertensive, labs are significant for WBC count of 13.3 and a hemoglobin of 11.9 but are otherwise within normal limits, CT scan of the head shows mild chronic small vessel disease throughout the deep white matter but no acute intracranial abnormality.  EKG shows atrial fibrillation at a rate of 79.    PT Comments    Patient continues to have right horizontal nystagmus, c/o double vision (objects on top of each other), but less double vision than yesterday and c/o increased lightheadedness when standing while looking up or forward, relief in symptoms when looking down.  Patient demonstrates slight improvement in standing balance for taking steps without AD, but has to walk very slowly while looking down to maintain balance, less drifting to the left noted when using RW, occasionally leans laterally to the left once fatigued.  Patient able to transfer to commode in bathroom to have a bowel movement before going back to bed with family members present at bedside after therapy.  Patient will benefit from continued physical therapy in hospital and  recommended venue below to increase strength, balance, endurance for safe ADLs and gait.   Follow Up Recommendations  CIR     Equipment Recommendations  Rolling walker with 5" wheels    Recommendations for Other Services       Precautions / Restrictions Precautions Precautions: Fall Restrictions Weight Bearing Restrictions: No    Mobility  Bed Mobility Overal bed mobility: Modified Independent                Transfers Overall transfer level: Needs assistance Equipment used: None;Rolling walker (2 wheeled) Transfers: Sit to/from UGI CorporationStand;Stand Pivot Transfers Sit to Stand: Min assist Stand pivot transfers: Min assist       General transfer comment: unsteady with frequent leaning on walls, nearby objects for support  Ambulation/Gait Ambulation/Gait assistance: Min assist Gait Distance (Feet): 65 Feet Assistive device: None;Rolling walker (2 wheeled) Gait Pattern/deviations: Decreased step length - left;Decreased stance time - left;Decreased stride length Gait velocity: decreased   General Gait Details: ambulated 10-15 feet without AD demonstrating slow labored ataxic like steps, has to look down and take slower steps to maintain balance, c/o increased lightheadedness, double vision when looking straight ahead, improved balance with RW, less drifting to the left than last visit, slightly increased distance/tolerance, limited secondary to fatiguing of left side   Stairs             Wheelchair Mobility    Modified Rankin (Stroke Patients Only)       Balance Overall balance assessment:  Needs assistance Sitting-balance support: Feet supported;No upper extremity supported Sitting balance-Leahy Scale: Good     Standing balance support: During functional activity;No upper extremity supported Standing balance-Leahy Scale: Poor Standing balance comment: fair/poor without assistive device, fair using RW                            Cognition  Arousal/Alertness: Awake/alert Behavior During Therapy: WFL for tasks assessed/performed Overall Cognitive Status: Within Functional Limits for tasks assessed                                        Exercises General Exercises - Lower Extremity Long Arc Quad: Seated;AROM;Strengthening;Both;10 reps Hip Flexion/Marching: Seated;AROM;Strengthening;Both;10 reps Toe Raises: Seated;AROM;Strengthening;Both;10 reps Heel Raises: Seated;AROM;Strengthening;Both;10 reps    General Comments        Pertinent Vitals/Pain Pain Assessment: 0-10 Pain Score: 2  Pain Location: numbness left side of face/mouth Pain Descriptors / Indicators: Numbness    Home Living                      Prior Function            PT Goals (current goals can now be found in the care plan section) Acute Rehab PT Goals Patient Stated Goal: return home after rehab Time For Goal Achievement: 12/17/17 Potential to Achieve Goals: Good Progress towards PT goals: Progressing toward goals    Frequency    7X/week      PT Plan Current plan remains appropriate    Co-evaluation              AM-PAC PT "6 Clicks" Daily Activity  Outcome Measure  Difficulty turning over in bed (including adjusting bedclothes, sheets and blankets)?: None Difficulty moving from lying on back to sitting on the side of the bed? : None Difficulty sitting down on and standing up from a chair with arms (e.g., wheelchair, bedside commode, etc,.)?: A Little Help needed moving to and from a bed to chair (including a wheelchair)?: A Little Help needed walking in hospital room?: A Little Help needed climbing 3-5 steps with a railing? : A Lot 6 Click Score: 19    End of Session Equipment Utilized During Treatment: Gait belt Activity Tolerance: Patient tolerated treatment well;Patient limited by fatigue Patient left: in bed;with call bell/phone within reach;with family/visitor present Nurse Communication:  Mobility status PT Visit Diagnosis: Unsteadiness on feet (R26.81);Other abnormalities of gait and mobility (R26.89);Muscle weakness (generalized) (M62.81)     Time: 1020-1046 PT Time Calculation (min) (ACUTE ONLY): 26 min  Charges:  $Therapeutic Exercise: 8-22 mins $Therapeutic Activity: 8-22 mins                    G Codes:       2:01 PM, 06-Dec-2017 Ocie Bob, MPT Physical Therapist with [facility] [phone] office [phone] mobile phone

## 2017-12-04 NOTE — Discharge Summary (Signed)
Physician Discharge Summary  Miguel Alvarez ZOX:[id number] DOB: Jun 24, 1950 DOA: 12/02/2017  PCP: Melvyn Neth, NP  Admit date: 12/02/2017 Discharge date: 12/04/2017  Time spent: 45 minutes  Recommendations for Outpatient Follow-up:  -To be discharged to [facility] inpatient rehab today.  Discharge Diagnoses:  Principal Problem:   Acute CVA (cerebrovascular accident) [facility]) Active Problems:   GERD (gastroesophageal reflux disease)   Dizziness   Hyperlipidemia   Atrial fibrillation, chronic (HCC)   Discharge Condition: Stable and improved  Filed Weights   12/02/17 0851 12/02/17 1215  Weight: 127 kg (280 lb) 131.9 kg (290 lb 12.6 oz)    History of present illness:   Miguel Alvarez is a 67 y.o. male with history of hypertension and hyperlipidemia who comes into the hospital today because of dizziness.  He states he went to bed last night around 9 PM and was in his usual state of health.  This morning he woke up at around 3 AM to go to the bathroom and fell out of bed, has been severely off balance and with severe dizziness.  He states that he has not been very coordinated since.  He has been having difficulty ambulating and notices that he falls to the left side.  He also notes blurry vision and double vision.  He also states that the left side of his face is numb and he is having difficulty hearing out of his left ear.  In the ED vital signs are noted to be stable, slightly hypertensive, labs are significant for WBC count of 13.3 and a hemoglobin of 11.9 but are otherwise within normal limits, CT scan of the head shows mild chronic small vessel disease throughout the deep white matter but no acute intracranial abnormality.  EKG shows atrial fibrillation at a rate of 79.  Admission requested for further evaluation and management.    Hospital Course:   Acute infarcts involving the left middle cerebellar peduncle and inferior cerebellum -Suggest possibility of embolic phenomenon, he does  have A. fib, but he also has marked  multivessel intracranial occlusive disease which could also be the etiology. -Has been seen in consultation by Dr. Gerilyn Pilgrim who is recommending addition of aspirin 325 mg to Eliquis with discontinuation of aspirin after 3 months. -Statin has been increased to high intensity dosing. -PT is recommending CIR, he will be discharged there today. -2D echo: Ejection fraction of 60 to 65% with normal wall motion, normal left ventricular diastolic function parameters. -Carotid Dopplers: Mild bilateral carotid bifurcation and proximal ICA plaque, resulting in less than 50% stenosis.  Antegrade bilateral vertebral arterial flow.   GERD -Continue PPI  Atrial fibrillation -Currently well controlled, continue Eliquis for anticoagulation  Hyperlipidemia -Continue statin, dose has been increased from 20 to 80 mg.     Procedures:  2D echo as above  Carotid Doppler as above    Consultations:  Neurology  Discharge Instructions    No Known Allergies    The results of significant diagnostics from this hospitalization (including imaging, microbiology, ancillary and laboratory) are listed below for reference.    Significant Diagnostic Studies: Ct Head Wo Contrast  Result Date: 12/02/2017 CLINICAL DATA:  Weakness, dizziness, difficulty walking. EXAM: CT HEAD WITHOUT CONTRAST TECHNIQUE: Contiguous axial images were obtained from the base of the skull through the vertex without intravenous contrast. COMPARISON:  None. FINDINGS: Brain: Chronic microvascular disease within the deep white matter. No acute intracranial abnormality. Specifically, no hemorrhage, hydrocephalus, mass lesion, acute infarction, or significant intracranial injury. Vascular: No hyperdense  vessel or unexpected calcification. Skull: No acute calvarial abnormality. Sinuses/Orbits: Visualized paranasal sinuses and mastoids clear. Orbital soft tissues unremarkable. Other: None IMPRESSION:  Mild chronic small vessel disease throughout the deep white matter. No acute intracranial abnormality. Electronically Signed   By: Charlett Nose M.D.   On: 12/02/2017 09:36   Mr Brain Wo Contrast  Result Date: 12/03/2017 CLINICAL DATA:  Dizziness, ataxia, and horizontal nystagmus. Left facial droop and numbness. Blurry/double vision. Recently diagnosed atrial fibrillation. EXAM: MRI HEAD WITHOUT CONTRAST MRA HEAD WITHOUT CONTRAST TECHNIQUE: Multiplanar, multiecho pulse sequences of the brain and surrounding structures were obtained without intravenous contrast. Angiographic images of the head were obtained using MRA technique without contrast. COMPARISON:  Head CT 12/02/2017 FINDINGS: MRI HEAD FINDINGS Brain: There is an acute infarct centered in the left middle cerebellar peduncle with slight extension into the dorsolateral upper medulla on the left and into the anterior left cerebellar hemisphere. No intracranial hemorrhage, mass, midline shift, or extra-axial fluid collection is identified. Multiple chronic lacunar infarcts are present in the right corona radiata and right temporoparietal periventricular white matter. The ventricles and sulci are within normal limits for age. Vascular: Major intracranial vascular flow voids are preserved. Skull and upper cervical spine: No suspicious marrow lesion. Sinuses/Orbits: Unremarkable orbits. Trace mucosal thickening in the paranasal sinuses. Small bilateral mastoid effusions. Other: None. MRA HEAD FINDINGS The visualized distal vertebral arteries are patent with the right being strongly dominant. The left vertebral artery is hypoplastic distal to the left PICA origin, which was not imaged. The right PICA origin is patent. The basilar artery is patent and small in caliber diffusely on a developmental basis with a superimposed moderate mid basilar stenosis noted. Large posterior communicating arteries are present bilaterally. The left P1 segment is hypoplastic. The  right P1 segment is not visualized and may be absent. The proximal right P2 segment appears diseased with poor visualization of the right PCA from the proximal to mid P2 level distally. The internal carotid arteries are patent from skull base to carotid termini with mild supraclinoid stenosis noted on the right. The right MCA is occluded at its origin without evidence of significant distal reconstitution evident on this MRA. The left MCA is patent with multiple severe M1 stenoses. The ACAs are patent without evidence of significant proximal stenosis. No aneurysm is identified. IMPRESSION: 1. Acute posterior fossa infarct centered in the left middle cerebellar peduncle. 2. 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 3. Intracranial atherosclerosis including moderate basilar artery and severe left M1 stenoses. 4. Right PCA occlusion versus severely diminished flow beginning at the proximal to mid P2 level. Electronically Signed   By: Sebastian Ache M.D.   On: 12/03/2017 08:53   US Carotid Bilateral (at [facility] Only)  Result Date: 12/03/2017 CLINICAL DATA:  Stroke, left facial droop. Coronary artery disease, previous tobacco abuse. EXAM: BILATERAL CAROTID DUPLEX ULTRASOUND TECHNIQUE: Wallace Cullens scale imaging, color Doppler and duplex ultrasound was performed of bilateral carotid and vertebral arteries in the neck. COMPARISON:  None. TECHNIQUE: Quantification of carotid stenosis is based on velocity parameters that correlate the residual internal carotid diameter with NASCET-based stenosis levels, using the diameter of the distal internal carotid lumen as the denominator for stenosis measurement. The following velocity measurements were obtained: PEAK SYSTOLIC/END DIASTOLIC RIGHT ICA:  87/13cm/sec CCA:                     70/10cm/sec SYSTOLIC ICA/CCA RATIO:  1.2 ECA:                     107cm/sec  LEFT ICA:                     97/28cm/sec CCA:                     103/19cm/sec SYSTOLIC ICA/CCA RATIO:  1.4 ECA:                     205cm/sec FINDINGS: RIGHT CAROTID ARTERY: Eccentric partially calcified plaque in the common carotid artery and bulb extending to ICA origin, without high-grade stenosis. Normal waveforms and color Doppler signal. RIGHT VERTEBRAL ARTERY:  Normal flow direction and waveform. LEFT CAROTID ARTERY: Partially calcified plaque in the bulb and proximal ICA. No high-grade stenosis. Normal waveforms and color Doppler signal. LEFT VERTEBRAL ARTERY: Normal flow direction and waveform. IMPRESSION: 1. Mild bilateral carotid bifurcation and proximal ICA plaque, resulting in less than 50% diameter stenosis. 2.  Antegrade bilateral vertebral arterial flow. Electronically Signed   By: Corlis Leak M.D.   On: 12/03/2017 14:10   Mr Maxine Glenn Head/brain ZO Cm  Result Date: 12/03/2017 CLINICAL DATA:  Dizziness, ataxia, and horizontal nystagmus. Left facial droop and numbness. Blurry/double vision. Recently diagnosed atrial fibrillation. EXAM: MRI HEAD WITHOUT CONTRAST MRA HEAD WITHOUT CONTRAST TECHNIQUE: Multiplanar, multiecho pulse sequences of the brain and surrounding structures were obtained without intravenous contrast. Angiographic images of the head were obtained using MRA technique without contrast. COMPARISON:  Head CT 12/02/2017 FINDINGS: MRI HEAD FINDINGS Brain: There is an acute infarct centered in the left middle cerebellar peduncle with slight extension into the dorsolateral upper medulla on the left and into the anterior left cerebellar hemisphere. No intracranial hemorrhage, mass, midline shift, or extra-axial fluid collection is identified. Multiple chronic lacunar infarcts are present in the right corona radiata and right temporoparietal periventricular white matter. The ventricles and sulci are within normal limits for age. Vascular: Major intracranial vascular flow voids are preserved.  Skull and upper cervical spine: No suspicious marrow lesion. Sinuses/Orbits: Unremarkable orbits. Trace mucosal thickening in the paranasal sinuses. Small bilateral mastoid effusions. Other: None. MRA HEAD FINDINGS The visualized distal vertebral arteries are patent with the right being strongly dominant. The left vertebral artery is hypoplastic distal to the left PICA origin, which was not imaged. The right PICA origin is patent. The basilar artery is patent and small in caliber diffusely on a developmental basis with a superimposed moderate mid basilar stenosis noted. Large posterior communicating arteries are present bilaterally. The left P1 segment is hypoplastic. The right P1 segment is not visualized and may be absent. The proximal right P2 segment appears diseased with poor visualization of the right PCA from the proximal to mid P2 level distally. The internal carotid arteries are patent from skull base to carotid termini with mild supraclinoid stenosis noted on the right. The right MCA is occluded at its origin without evidence of significant distal reconstitution evident on this MRA. The left MCA is patent with multiple severe M1 stenoses. The ACAs are patent without evidence of significant proximal stenosis. No aneurysm is identified. IMPRESSION: 1. Acute posterior fossa infarct centered in the left middle cerebellar peduncle. 2. 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 3. Intracranial atherosclerosis including moderate basilar artery and severe left M1 stenoses. 4. Right PCA occlusion versus severely diminished flow beginning at the proximal to mid P2 level. Electronically Signed   By: Sebastian AcheAllen  Grady M.D.   On: 12/03/2017 08:53    Microbiology: No results found for this or any previous visit (from the past 240 hour(s)).   Labs: Basic Metabolic Panel: Recent Labs  Lab 12/02/17 0906  NA 141    K 3.8  CL 106  CO2 25  GLUCOSE 170*  BUN 21*  CREATININE 0.95  CALCIUM 9.0   Liver Function Tests: No results for input(s): AST, ALT, ALKPHOS, BILITOT, PROT, ALBUMIN in the last 168 hours. No results for input(s): LIPASE, AMYLASE in the last 168 hours. No results for input(s): AMMONIA in the last 168 hours. CBC: Recent Labs  Lab 12/02/17 0906  WBC 13.3*  NEUTROABS 11.4*  HGB 11.9*  HCT 36.8*  MCV 92.0  PLT 173   Cardiac Enzymes: No results for input(s): CKTOTAL, CKMB, CKMBINDEX, TROPONINI in the last 168 hours. BNP: BNP (last 3 results) No results for input(s): BNP in the last 8760 hours.  ProBNP (last 3 results) No results for input(s): PROBNP in the last 8760 hours.  CBG: Recent Labs  Lab 12/02/17 0900  GLUCAP 162*       Signed:  Chaya JanEstela Hernandez Acosta  [facility] Pager: [phone] 12/04/2017, 12:16 PM

## 2017-12-04 NOTE — Care Management Note (Signed)
Case Management Note  Patient Details  Name: Miguel Alvarez MRN: [id number] Date of Birth: 05/14/1951  Subjective/Objective:            Admitted with CVA.         Action/Plan: DC to CIR today.   Expected Discharge Date:  12/04/17               Expected Discharge Plan:  IP Rehab Facility  In-House Referral:  NA  Discharge planning Services  CM Consult  Post Acute Care Choice:  NA Choice offered to:  NA  DME Arranged:    DME Agency:     HH Arranged:    HH Agency:     Status of Service:  Completed, signed off  If discussed at [employer] Length of Stay Meetings, dates discussed:    Additional Comments:  Malcolm MetroChildress, [employer] Zaccone Demske, RN 12/04/2017, 1:04 PM

## 2017-12-04 NOTE — Progress Notes (Signed)
[facility] re[facility] admissions - Have confirmed with case manager that patient is medically ready today.  Bed available and will admit to acute inpatient rehab today.  Call me for questions.  #[phone]

## 2017-12-04 NOTE — H&P (Signed)
Physical Medicine and Rehabilitation Admission H&P       Chief Complaint  Patient presents with  . Dizziness  : HPI: [name] is a 67 year old right-handed male with history of hypertension and hyperlipidemia as well as atrial fibrillation maintained on Eliquis and remote tobacco abuse.  Per chart review patient lives with spouse.  Independent prior to admission.  One level home with basement.  3 steps to entry.  Patient has a supportive family.  Presented to [facility] 12/02/2017 with dizziness, blurred vision and fall while getting up to the bathroom.  Cranial CT scan negative for acute changes.  Patient did not receive TPA.  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  Carotid Dopplers with mild bilateral carotid bifurcation and proximal ICA plaque, resulting in less than 50% diameter stenosis.  Echocardiogram with ejection fraction 65% no wall motion abnormalities.  Neurology consulted presently on aspirin for CVA prophylaxis as well as Eliquis and plan discontinuation of aspirin after 3 months.  Tolerating a regular diet.  Physical and occupational therapy evaluations completed.  Patient was admitted for a comprehensive rehab program.  Review of Systems  Constitutional: Negative for chills and fever.  HENT: Negative for hearing loss.   Eyes: Positive for blurred vision and double vision.  Respiratory: Negative for cough and shortness of breath.   Cardiovascular: Positive for palpitations and leg swelling.  Gastrointestinal: Positive for constipation. Negative for nausea and vomiting.       GERD  Genitourinary: Negative for dysuria, flank pain  and hematuria.  Musculoskeletal: Positive for myalgias.  Skin: Negative for rash.  Neurological: Positive for dizziness and focal weakness.  All other systems reviewed and are negative.      Past Medical History:  Diagnosis Date  . Arthritis    osteoarthritis  . Atrial fibrillation ([name])   . GERD (gastroesophageal reflux disease)         Past Surgical History:  Procedure Laterality Date  . COLONOSCOPY  pandya   6-7 years ago, some polyps  . COLONOSCOPY WITH PROPOFOL N/A 10/28/2015   Procedure: COLONOSCOPY WITH PROPOFOL;  Surgeon: Daneil Dolin, MD;  [facility]: [facility];  Service: Endoscopy;  Laterality: N/A;  0730  . ESOPHAGOGASTRODUODENOSCOPY     Barrett's esophagus per patient  . ESOPHAGOGASTRODUODENOSCOPY (EGD) WITH PROPOFOL N/A 10/28/2015   Procedure: ESOPHAGOGASTRODUODENOSCOPY (EGD) WITH PROPOFOL;  Surgeon: Daneil Dolin, MD;  Location: [facility];  Service: Endoscopy;  Laterality: N/A;  . removal of bone foot Left    age 14        Family History  Problem Relation Age of Onset  . Colon cancer Neg Hx    Social History:  reports that he quit smoking about 10 years ago. His smoking use included cigarettes. He has a 40.00 pack-year smoking history. He has never used smokeless tobacco. He reports that he drank about 7.2 oz of alcohol per week. He reports that he does not use drugs. Allergies: No Known Allergies       Medications Prior to Admission  Medication Sig Dispense Refill  . amLODipine (NORVASC) 5 MG tablet Take 5 mg by mouth daily.  0  . apixaban (  ELIQUIS) 5 MG TABS tablet Take 5 mg by mouth 2 (two) times daily.    Marland Kitchen atorvastatin (LIPITOR) 20 MG tablet Take 20 mg by mouth daily.  0  . Dexlansoprazole (DEXILANT) 30 MG capsule Take 30 mg by mouth daily.      Drug Regimen Review Drug regimen was reviewed and remains appropriate with no significant issues identified  Home: Home Living Family/patient expects to be discharged to::  Private residence Living Arrangements: Spouse/significant other Available Help at Discharge: Family Type of Home: House Home Access: Stairs to enter Technical brewer of Steps: 3 Entrance Stairs-Rails: None Home Layout: Laundry or work area in basement, Able to live on main level with bedroom/bathroom Bathroom Shower/Tub: Public librarian, Architectural technologist: Programmer, systems: Yes Home Equipment: None   Functional History: Prior Function Level of Independence: Independent  Functional Status:  Mobility: Bed Mobility Overal bed mobility: Modified Independent Transfers Overall transfer level: Needs assistance Equipment used: None, Rolling walker (2 wheeled) Transfers: Sit to/from Stand, [place] to Stand: [place], Mod assist Stand pivot transfers: Min assist, Mod assist General transfer comment: very unsteady with near fall without use of assistive device, safer using RW Ambulation/Gait Ambulation/Gait assistance: Mod assist, Min assist Gait Distance (Feet): 55 Feet Assistive device: Rolling walker (2 wheeled) Gait Pattern/deviations: Decreased step length - left, Decreased stance time - left, Decreased stride length, Drifts right/left General Gait Details: labored cadence with frequent leaning to left side especially once fatigued, c/o increasing weakness left side during gait training, mild drift to the left  Gait velocity: decreased  ADL: ADL Overall ADL's : Needs assistance/impaired Lower Body Dressing: Set up, Sitting/lateral leans Lower Body Dressing Details (indicate cue type and reason): Patient was seated to doff/donn hospital socks.  Toilet Transfer: Minimal assistance, Regular Toilet, Grab bars, RW Toileting- Clothing Manipulation and Hygiene: Minimal assistance, Sit to/from stand, Cueing for safety Functional mobility during ADLs: Moderate assistance, Rolling walker General ADL Comments: During ambulation, PT provided  physical assistance which appeared to vary from Min-Mod or Max depending on patient's frequent loss of balance towards the left. OT provided SBA assistance on right side to cue patient to keep walker wheels on ground.   Cognition: Cognition Overall Cognitive Status: Within Functional Limits for tasks assessed Orientation Level: Oriented X4 Cognition Arousal/Alertness: Awake/alert Behavior During Therapy: WFL for tasks assessed/performed Overall Cognitive Status: Within Functional Limits for tasks assessed  Physical Exam: Blood pressure 138/60, pulse 77, temperature 97.7 F (36.5 C), temperature source Oral, resp. rate 20, height '6\' 2"'$  (1.88 m), weight 131.9 kg (290 lb 12.6 oz), SpO2 99 %. Physical Exam  Constitutional: He appears well-developed.  HENT:  Head: Normocephalic.  Eyes: Pupils are equal, round, and reactive to light.  Neck: Normal range of motion.  Cardiovascular: Normal rate.  Respiratory: Effort normal.  GI: Soft.  Neurological: He is alert.  Patient is alert.  Speech is fluent.  Follows simple commands. Horizontal nystagmus with left and right gaze. Mild left-sided facial sensory loss. Mild dysmetria LUE and LLE.   Skin: Skin is warm.  Psychiatric:  Cooperative, flat affect    LabResultsLast48Hours        Results for orders placed or performed during the hospital encounter of 12/02/17 (from the past 48 hour(s))  POC CBG, ED     Status: Abnormal   Collection Time: 12/02/17  9:00 AM  Result Value Ref Range   Glucose-Capillary 162 (H) 65 - 99 mg/dL  CBC with Differential  Status: Abnormal   Collection Time: 12/02/17  9:06 AM  Result Value Ref Range   WBC 13.3 (H) 4.0 - 10.5 K/uL   RBC 4.00 (L) 4.22 - 5.81 MIL/uL   Hemoglobin 11.9 (L) 13.0 - 17.0 g/dL   HCT 36.8 (L) 39.0 - 52.0 %   MCV 92.0 78.0 - 100.0 fL   MCH 29.8 26.0 - 34.0 pg   MCHC 32.3 30.0 - 36.0 g/dL   RDW 13.1 11.5 - 15.5 %   Platelets 173 150 - 400 K/uL   Neutrophils  Relative % 85 %   Neutro Abs 11.4 (H) 1.7 - 7.7 K/uL   Lymphocytes Relative 6 %   Lymphs Abs 0.8 0.7 - 4.0 K/uL   Monocytes Relative 8 %   Monocytes Absolute 1.1 (H) 0.1 - 1.0 K/uL   Eosinophils Relative 1 %   Eosinophils Absolute 0.1 0.0 - 0.7 K/uL   Basophils Relative 0 %   Basophils Absolute 0.0 0.0 - 0.1 K/uL    Comment: Performed at [facility], [address], [place], [place] [address]  Basic metabolic panel     Status: Abnormal   Collection Time: 12/02/17  9:06 AM  Result Value Ref Range   Sodium 141 135 - 145 mmol/L   Potassium 3.8 3.5 - 5.1 mmol/L   Chloride 106 101 - 111 mmol/L   CO2 25 22 - 32 mmol/L   Glucose, Bld 170 (H) 65 - 99 mg/dL   BUN 21 (H) 6 - 20 mg/dL   Creatinine, Ser 0.95 0.61 - 1.24 mg/dL   Calcium 9.0 8.9 - 10.3 mg/dL   GFR calc non Af Amer >60 >60 mL/min   GFR calc Af Amer >60 >60 mL/min    Comment: (NOTE) The eGFR has been calculated using the CKD EPI equation. This calculation has not been validated in all clinical situations. eGFR's persistently <60 mL/min signify possible Chronic Kidney Disease.    Anion gap 10 5 - 15    Comment: Performed at [facility], [address], [place], [place] [address]  APTT     Status: Abnormal   Collection Time: 12/02/17  9:06 AM  Result Value Ref Range   aPTT 37 (H) 24 - 36 seconds    Comment:        IF BASELINE aPTT IS ELEVATED, SUGGEST PATIENT RISK ASSESSMENT BE USED TO DETERMINE APPROPRIATE ANTICOAGULANT THERAPY. Performed at [facility], [address] [place], [place] [address]   Protime-INR     Status: Abnormal   Collection Time: 12/02/17  9:06 AM  Result Value Ref Range   Prothrombin Time 15.6 (H) 11.4 - 15.2 seconds   INR 1.25     Comment: Performed at [facility], [address], [place], [place] [address]  Hemoglobin A1c     Status: Abnormal   Collection Time: 12/03/17  6:35 AM  Result Value Ref Range   Hgb A1c MFr Bld 6.0 (H) 4.8 - 5.6 %     Comment: (NOTE) Pre diabetes:          5.7%-6.4% Diabetes:              >6.4% Glycemic control for   <7.0% adults with diabetes    Mean Plasma Glucose 125.5 mg/dL    Comment: Performed at [facility] [address], [place], [place] [address]  Lipid panel     Status: Abnormal   Collection Time: 12/03/17  6:35 AM  Result Value Ref Range   Cholesterol 117  0 - 200 mg/dL   Triglycerides 52 <150 mg/dL   HDL 38 (L) >40 mg/dL   Total CHOL/HDL Ratio 3.1 RATIO   VLDL 10 0 - 40 mg/dL   LDL Cholesterol 69 0 - 99 mg/dL    Comment:        Total Cholesterol/HDL:CHD Risk Coronary Heart Disease Risk Table                     Men   Women  1/2 Average Risk   3.4   3.3  Average Risk       5.0   4.4  2 X Average Risk   9.6   7.1  3 X Average Risk  23.4   11.0        Use the calculated Patient Ratio above and the CHD Risk Table to determine the patient's CHD Risk.        ATP III CLASSIFICATION (LDL):  <100     mg/dL   Optimal  100-129  mg/dL   Near or Above                    Optimal  130-159  mg/dL   Borderline  160-189  mg/dL   High  >190     mg/dL   Very High Performed at [place] [place], [place] [address]       ImagingResults(Last48hours)  Ct Head Wo Contrast  Result Date: 12/02/2017 CLINICAL DATA:  Weakness, dizziness, difficulty walking. EXAM: CT HEAD WITHOUT CONTRAST TECHNIQUE: Contiguous axial images were obtained from the base of the skull through the vertex without intravenous contrast. COMPARISON:  None. FINDINGS: Brain: Chronic microvascular disease within the deep white matter. No acute intracranial abnormality. Specifically, no hemorrhage, hydrocephalus, mass lesion, acute infarction, or significant intracranial injury. Vascular: No hyperdense vessel or unexpected calcification. Skull: No acute calvarial abnormality. Sinuses/Orbits: Visualized paranasal sinuses and mastoids clear. Orbital soft tissues unremarkable. 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 No acute intracranial abnormality. Electronically Signed   By: Rolm Baptise M.D.   On: 12/02/2017 09:36   Mr Brain Wo Contrast  Result Date: 12/03/2017 CLINICAL DATA:  Dizziness, ataxia, and horizontal nystagmus. Left facial droop and numbness. Blurry/double vision. Recently diagnosed atrial fibrillation. EXAM: MRI HEAD WITHOUT CONTRAST MRA HEAD WITHOUT CONTRAST TECHNIQUE: Multiplanar, multiecho pulse sequences of the brain and surrounding structures were obtained without intravenous contrast. Angiographic images of the head were obtained using MRA technique without contrast. COMPARISON:  Head CT 12/02/2017 FINDINGS: MRI HEAD FINDINGS Brain: There is an acute infarct centered in the left middle cerebellar peduncle with slight extension into the dorsolateral upper medulla on the left and into the anterior left cerebellar hemisphere. No intracranial hemorrhage, mass, midline shift, or extra-axial fluid collection is identified. Multiple chronic lacunar infarcts are present in the right corona radiata and right temporoparietal periventricular white matter. The ventricles and sulci are within normal limits for age. Vascular: Major intracranial vascular flow voids are preserved. Skull and upper cervical spine: No suspicious marrow lesion. Sinuses/Orbits: Unremarkable orbits. Trace mucosal thickening in the paranasal sinuses. Small bilateral mastoid effusions. Other: None. MRA HEAD FINDINGS The visualized distal vertebral arteries are patent with the right being strongly dominant. The left vertebral artery is hypoplastic distal to the left PICA origin, which was not imaged. The right PICA origin is patent. The basilar artery is patent and small in caliber diffusely on a developmental basis with a  superimposed moderate mid basilar stenosis noted. Large posterior communicating arteries are present bilaterally. The left P1 segment is  hypoplastic. The right P1 segment is not visualized and may be absent. The proximal right P2 segment appears diseased with poor visualization of the right PCA from the proximal to mid P2 level distally. The internal carotid arteries are patent from skull base to carotid termini with mild supraclinoid stenosis noted on the right. The right MCA is occluded at its origin without evidence of significant distal reconstitution evident on this MRA. The left MCA is patent with multiple severe M1 stenoses. The ACAs are patent without evidence of significant proximal stenosis. No aneurysm is identified. IMPRESSION: 1. Acute posterior fossa infarct centered in the left middle cerebellar peduncle. 2. 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 3. Intracranial atherosclerosis including moderate basilar artery and severe left M1 stenoses. 4. Right PCA occlusion versus severely diminished flow beginning at the proximal to mid P2 level. Electronically Signed   By: Logan Bores M.D.   On: 12/03/2017 08:53   Mr Jodene Nam Head/brain QV Cm  Result Date: 12/03/2017 CLINICAL DATA:  Dizziness, ataxia, and horizontal nystagmus. Left facial droop and numbness. Blurry/double vision. Recently diagnosed atrial fibrillation. EXAM: MRI HEAD WITHOUT CONTRAST MRA HEAD WITHOUT CONTRAST TECHNIQUE: Multiplanar, multiecho pulse sequences of the brain and surrounding structures were obtained without intravenous contrast. Angiographic images of the head were obtained using MRA technique without contrast. COMPARISON:  Head CT 12/02/2017 FINDINGS: MRI HEAD FINDINGS Brain: There is an acute infarct centered in the left middle cerebellar peduncle with slight extension into the dorsolateral upper medulla on the left and into the anterior left cerebellar hemisphere. No intracranial hemorrhage, mass, midline shift, or extra-axial fluid collection is  identified. Multiple chronic lacunar infarcts are present in the right corona radiata and right temporoparietal periventricular white matter. The ventricles and sulci are within normal limits for age. Vascular: Major intracranial vascular flow voids are preserved. Skull and upper cervical spine: No suspicious marrow lesion. Sinuses/Orbits: Unremarkable orbits. Trace mucosal thickening in the paranasal sinuses. Small bilateral mastoid effusions. Other: None. MRA HEAD FINDINGS The visualized distal vertebral arteries are patent with the right being strongly dominant. The left vertebral artery is hypoplastic distal to the left PICA origin, which was not imaged. The right PICA origin is patent. The basilar artery is patent and small in caliber diffusely on a developmental basis with a superimposed moderate mid basilar stenosis noted. Large posterior communicating arteries are present bilaterally. The left P1 segment is hypoplastic. The right P1 segment is not visualized and may be absent. The proximal right P2 segment appears diseased with poor visualization of the right PCA from the proximal to mid P2 level distally. The internal carotid arteries are patent from skull base to carotid termini with mild supraclinoid stenosis noted on the right. The right MCA is occluded at its origin without evidence of significant distal reconstitution evident on this MRA. The left MCA is patent with multiple severe M1 stenoses. The ACAs are patent without evidence of significant proximal stenosis. No aneurysm is identified. IMPRESSION: 1. Acute posterior fossa infarct centered in the left middle cerebellar peduncle. 2. 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 3. Intracranial atherosclerosis including moderate basilar artery and severe left M1 stenoses. 4. Right PCA occlusion versus severely diminished flow beginning  at the  proximal to mid P2 level. Electronically Signed   By: Logan Bores M.D.   On: 12/03/2017 08:53        Medical Problem List and Plan: 1.   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  DVT Prophylaxis/Anticoagulation: Eliquis.  Monitor for any bleeding episodes 3. Pain Management: Tylenol as needed 4. Mood: Provide emotional support 5. Neuropsych: This patient is capable of making decisions on his own behalf. 6. Skin/Wound Care: Routine skin checks 7. Fluids/Electrolytes/Nutrition: Routine in and outs with follow-up chemistries 8.  Hypertension.  Norvasc 5 mg daily.  Monitor with increased mobility 9.  Atrial fibrillation.  Cardiac rate controlled.  Continue Eliquis 10.  Hyperlipidemia.  Lipitor 11.  GERD.  Protonix   Post Admission Physician Evaluation: 1. Functional deficits secondary  to left cerebellar peduncle infarct. 2. Patient is admitted to receive collaborative, interdisciplinary care between the physiatrist, rehab nursing staff, and therapy team. 3. Patient's level of medical complexity and substantial therapy needs in context of that medical necessity cannot be provided at a lesser intensity of care such as a SNF. 4. Patient has experienced substantial functional loss from his/her baseline which was documented above under the "Functional History" and "Functional Status" headings.  Judging by the patient's diagnosis, physical exam, and functional history, the patient has potential for functional progress which will result in measurable gains while on inpatient rehab.  These gains will be of substantial and practical use upon discharge  in facilitating mobility and self-care at the household level. 5. Physiatrist will provide 24 hour management of medical needs as well as oversight of the therapy plan/treatment and provide guidance as appropriate regarding the  interaction of the two. 6. The Preadmission Screening has been reviewed and patient status is unchanged unless otherwise stated above. 7. 24 hour rehab nursing will assist with bladder management, bowel management, safety, skin/wound care, disease management, medication administration and patient education  and help integrate therapy concepts, techniques,education, etc. 8. PT will assess and treat for/with: Lower extremity strength, range of motion, stamina, balance, functional mobility, safety, adaptive techniques and equipment, vestibular rx, family ed.   Goals are: supervision. 9. OT will assess and treat for/with: ADL's, functional mobility, safety, upper extremity strength, adaptive techniques and equipment, NMR, family ed, vestibular rx.   Goals are: supervision. Therapy may proceed with showering this patient. 10. SLP will assess and treat for/with: n/a.  Goals are: n/a. 11. Case Management and Social Worker will assess and treat for psychological issues and discharge planning. 12. Team conference will be held weekly to assess progress toward goals and to determine barriers to discharge. 13. Patient will receive at least 3 hours of therapy per day at least 5 days per week. 14. ELOS: 10-14 days       15. Prognosis:  excellent   I have personally reviewed laboratory data, imaging studies, as well as relevant notes and concur with the physician assistant's documentation above.  Meredith Staggers, MD, FAAPMR     Lavon Paganini [place], PA-C 12/03/2017

## 2017-12-05 ENCOUNTER — Inpatient Hospital Stay (HOSPITAL_COMMUNITY): Payer: Medicare Other | Admitting: Occupational Therapy

## 2017-12-05 ENCOUNTER — Inpatient Hospital Stay (HOSPITAL_COMMUNITY): Payer: Medicare Other | Admitting: Physical Therapy

## 2017-12-05 DIAGNOSIS — I639 Cerebral infarction, unspecified: Secondary | ICD-10-CM

## 2017-12-05 DIAGNOSIS — I63512 Cerebral infarction due to unspecified occlusion or stenosis of left middle cerebral artery: Secondary | ICD-10-CM

## 2017-12-05 DIAGNOSIS — I482 Chronic atrial fibrillation: Secondary | ICD-10-CM

## 2017-12-05 LAB — CBC WITH DIFFERENTIAL/PLATELET
Abs Immature Granulocytes: 0 10*3/uL (ref 0.0–0.1)
BASOS ABS: 0.1 10*3/uL (ref 0.0–0.1)
Basophils Relative: 1 %
EOS ABS: 0.2 10*3/uL (ref 0.0–0.7)
EOS PCT: 2 %
HCT: 41.9 % (ref 39.0–52.0)
Hemoglobin: 13.4 g/dL (ref 13.0–17.0)
Immature Granulocytes: 0 %
Lymphocytes Relative: 17 %
Lymphs Abs: 1.7 10*3/uL (ref 0.7–4.0)
MCH: 29.4 pg (ref 26.0–34.0)
MCHC: 32 g/dL (ref 30.0–36.0)
MCV: 91.9 fL (ref 78.0–100.0)
MONO ABS: 0.7 10*3/uL (ref 0.1–1.0)
Monocytes Relative: 7 %
Neutro Abs: 7.4 10*3/uL (ref 1.7–7.7)
Neutrophils Relative %: 73 %
Platelets: 271 10*3/uL (ref 150–400)
RBC: 4.56 MIL/uL (ref 4.22–5.81)
RDW: 12.6 % (ref 11.5–15.5)
WBC: 10.2 10*3/uL (ref 4.0–10.5)

## 2017-12-05 LAB — COMPREHENSIVE METABOLIC PANEL
ALBUMIN: 3.4 g/dL — AB (ref 3.5–5.0)
ALK PHOS: 86 U/L (ref 38–126)
ALT: 36 U/L (ref 17–63)
ANION GAP: 8 (ref 5–15)
AST: 19 U/L (ref 15–41)
BILIRUBIN TOTAL: 0.9 mg/dL (ref 0.3–1.2)
BUN: 15 mg/dL (ref 6–20)
CALCIUM: 9.5 mg/dL (ref 8.9–10.3)
CO2: 25 mmol/L (ref 22–32)
Chloride: 109 mmol/L (ref 101–111)
Creatinine, Ser: 1.07 mg/dL (ref 0.61–1.24)
GFR calc Af Amer: >60 mL/min (ref >60)
GFR calc non Af Amer: >60 mL/min (ref >60)
GLUCOSE: 128 mg/dL — AB (ref 65–99)
POTASSIUM: 4.3 mmol/L (ref 3.5–5.1)
SODIUM: 142 mmol/L (ref 135–145)
TOTAL PROTEIN: 7 g/dL (ref 6.5–8.1)

## 2017-12-05 MED ORDER — METHOCARBAMOL 500 MG PO TABS
500.0000 mg | ORAL_TABLET | Freq: Four times a day (QID) | ORAL | Status: DC | PRN
  Administered 2017-12-06 – 2017-12-09 (×6): 500 mg via ORAL
  Filled 2017-12-05 (×6): qty 1

## 2017-12-05 NOTE — Care Management Note (Signed)
Inpatient Rehabilitation Center Individual Statement of Services  Patient Name:  Miguel Alvarez  Date:  12/05/2017  Welcome to the Inpatient Rehabilitation Center.  Our goal is to provide you with an individualized program based on your diagnosis and situation, designed to meet your specific needs.  With this comprehensive rehabilitation program, you will be expected to participate in at least 3 hours of rehabilitation therapies Monday-Friday, with modified therapy programming on the weekends.  Your rehabilitation program will include the following services:  Physical Therapy (PT), Occupational Therapy (OT), Speech Therapy (ST), 24 hour per day rehabilitation nursing, Therapeutic Recreaction (TR), Case Management (Social Worker), Rehabilitation Medicine, Nutrition Services and Pharmacy Services  Weekly team conferences will be held on Wednesdya to discuss your progress.  Your Social Worker will talk with you frequently to get your input and to update you on team discussions.  Team conferences with you and your family in attendance may also be held.  Expected length of stay: 10-14 days  Overall anticipated outcome: supervision with verbal cueing  Depending on your progress and recovery, your program may change. Your Social Worker will coordinate services and will keep you informed of any changes. Your Social Worker's name and contact numbers are listed  below.  The following services may also be recommended but are not provided by the Inpatient Rehabilitation Center:   Driving Evaluations  Home Health Rehabiltiation Services  Outpatient Rehabilitation Services    Arrangements will be made to provide these services after discharge if needed.  Arrangements include referral to agencies that provide these services.  Your insurance has been verified to be:  Medicare & [employer]et[employer] Your primary doctor is:  Melvyn NethFrank Dolan  Pertinent information will be shared with your doctor and your insurance  company.  Social Worker:  Amada JupiterLucy Hoyle, [place] [phone] or ([phone]) [phone]   Information discussed with and copy given to patient by: Lucy Chrisupree, Siddhartha Hoback G, 12/05/2017, 3:15 PM

## 2017-12-05 NOTE — Progress Notes (Signed)
Patient information reviewed and entered into eRehab system by Trenise Turay, RN, CRRN, PPS Coordinator.  Information including medical coding and functional independence measure will be reviewed and updated through discharge.     Per nursing patient was given "Data Collection Information Summary for Patients in Inpatient Rehabilitation Facilities with attached "Privacy Act Statement-Health Care Records" upon admission.  

## 2017-12-05 NOTE — Discharge Instructions (Addendum)
STROKE/TIA DISCHARGE INSTRUCTIONS SMOKING Cigarette smoking nearly doubles your risk of having a stroke & is the single most alterable risk factor  If you smoke or have smoked in the last 12 months, you are advised to quit smoking for your health.  Most of the excess cardiovascular risk related to smoking disappears within a year of stopping.  Ask you doctor about anti-smoking medications  [place] Quit Line: 1-800-QUIT NOW  Free Smoking Cessation Classes [phone]  CHOLESTEROL Know your levels; limit fat & cholesterol in your diet  Lipid Panel     Component Value Date/Time   CHOL 117 12/03/2017 0635   TRIG 52 12/03/2017 0635   HDL 38 (L) 12/03/2017 0635   CHOLHDL 3.1 12/03/2017 0635   VLDL 10 12/03/2017 0635   LDLCALC 69 12/03/2017 0635      Many patients benefit from treatment even if their cholesterol is at goal.  Goal: Total Cholesterol (CHOL) less than 160  Goal:  Triglycerides (TRIG) less than 150  Goal:  HDL greater than 40  Goal:  LDL (LDLCALC) less than 100   BLOOD PRESSURE American Stroke Association blood pressure target is less that 120/80 mm/Hg  Your discharge blood pressure is:  BP: (!) 149/61  Monitor your blood pressure  Limit your salt and alcohol intake  Many individuals will require more than one medication for high blood pressure  DIABETES (A1c is a blood sugar average for last 3 months) Goal HGBA1c is under 7% (HBGA1c is blood sugar average for last 3 months)  Diabetes: No known diagnosis of diabetes    Lab Results  Component Value Date   HGBA1C 6.0 (H) 12/03/2017     Your HGBA1c can be lowered with medications, healthy diet, and exercise.  Check your blood sugar as directed by your physician  Call your physician if you experience unexplained or low blood sugars.  PHYSICAL ACTIVITY/REHABILITATION Goal is 30 minutes at least 4 days per week  Activity: Increase activity slowly, Therapies: Physical Therapy: Home Health Return to work:    Activity decreases your risk of heart attack and stroke and makes your heart stronger.  It helps control your weight and blood pressure; helps you relax and can improve your mood.  Participate in a regular exercise program.  Talk with your doctor about the best form of exercise for you (dancing, walking, swimming, cycling).  DIET/WEIGHT Goal is to maintain a healthy weight  Your discharge diet is:  Diet Order           Diet Heart Room service appropriate? Yes; Fluid consistency: Thin  Diet effective now          liquids Your height is:  Height: 6\' 2"  (188 cm) Your current weight is: Weight: 129 kg (284 lb 6.3 oz) Your Body Mass Index (BMI) is:  BMI (Calculated): 36.5  Following the type of diet specifically designed for you will help prevent another stroke.  Your goal weight range is:    Your goal Body Mass Index (BMI) is 19-24.  Healthy food habits can help reduce 3 risk factors for stroke:  High cholesterol, hypertension, and excess weight.  RESOURCES Stroke/Support Group:  Call [phone]   STROKE EDUCATION PROVIDED/REVIEWED AND GIVEN TO PATIENT Stroke warning signs and symptoms How to activate emergency medical system (call 911). Medications prescribed at discharge. Need for follow-up after discharge. Personal risk factors for stroke. Pneumonia vaccine given:  Flu vaccine given:  My questions have been answered, the writing is legible, and I understand these  instructions.  I will adhere to these goals & educational materials that have been provided to me after my discharge from the hospital.   Inpatient Rehab Discharge Instructions  [facility] Discharge date and time: No discharge date for patient encounter.   Activities/Precautions/ Functional Status: Activity: activity as tolerated Diet: regular diet Wound Care: none needed Functional status:  ___ No restrictions     ___ Walk up steps independently ___ 24/7 supervision/assistance   ___ Walk up steps with  assistance ___ Intermittent supervision/assistance  ___ Bathe/dress independently ___ Walk with walker     _x__ Bathe/dress with assistance ___ Walk Independently    ___ Shower independently ___ Walk with assistance    ___ Shower with assistance ___ No alcohol     ___ Return to work/school ________   COMMUNITY REFERRALS UPON DISCHARGE:    Outpatient: PT     OT                  Ag[facility] Phone: [phone]               Appointment Date/Time:  7/2/@ 10:15 am (OT)                                                                 7/9 @ 10:15 am (PT)    Please bring your insurance cards and a list of your medications to the appointment  Medical Equipment/Items Ordered: rolling walker, tub bench                                                     Agency/Supplier: [employer] @ [phone]    Special Instructions: No driving smoking or alcohol   My questions have been answered and I understand these instructions. I will adhere to these goals and the provided educational materials after my discharge from the hospital.  Patient/Caregiver Signature _______________________________ Date __________  Clinician Signature _______________________________________ Date __________  Please bring this form and your medication list with you to all your follow-up doctor's appointments.   Information on my medicine - ELIQUIS (apixaban)  This medication education was reviewed with me or my healthcare representative as part of my discharge preparation.    Why was Eliquis prescribed for you? Eliquis was prescribed for you to reduce the risk of a blood clot forming that can cause a stroke if you have a medical condition called atrial fibrillation (a type of irregular heartbeat).  What do You need to know about Eliquis ? Take your Eliquis TWICE DAILY - one tablet in the morning and one tablet in the evening with or without food. If you have difficulty swallowing the  tablet whole please discuss with your pharmacist how to take the medication safely.  Take Eliquis exactly as prescribed by your doctor and DO NOT stop taking Eliquis without talking to the doctor who prescribed the medication.  Stopping may increase your risk of developing a stroke.  Refill your prescription before you run out.  After discharge, you should have regular check-up appointments with your healthcare provider that is prescribing your Eliquis.  In  the future your dose may need to be changed if your kidney function or weight changes by a significant amount or as you get older.  What do you do if you miss a dose? If you miss a dose, take it as soon as you remember on the same day and resume taking twice daily.  Do not take more than one dose of ELIQUIS at the same time to make up a missed dose.  Important Safety Information A possible side effect of Eliquis is bleeding. You should call your healthcare provider right away if you experience any of the following: ? Bleeding from an injury or your nose that does not stop. ? Unusual colored urine (red or dark brown) or unusual colored stools (red or black). ? Unusual bruising for unknown reasons. ? A serious fall or if you hit your head (even if there is no bleeding).  Some medicines may interact with Eliquis and might increase your risk of bleeding or clotting while on Eliquis. To help avoid this, consult your healthcare provider or pharmacist prior to using any new prescription or non-prescription medications, including herbals, vitamins, non-steroidal anti-inflammatory drugs (NSAIDs) and supplements.  This website has more information on Eliquis (apixaban): http://www.eliquis.com/eliquis/home

## 2017-12-05 NOTE — Progress Notes (Signed)
Patient has had a headache most of the day.  He has had tylenol to relieve it but it doesn't completely take it away.  By 4 PM today the patient stated it was finally gone but his vision was still a little blurry.

## 2017-12-05 NOTE — Progress Notes (Signed)
Ranelle Oyster, MD  Physician  Physical Medicine and Rehabilitation  PMR Pre-admission  Signed  Date of Service:  12/03/2017 3:13 PM       Related encounter: ED to Hosp-Admission (Discharged) from 12/02/2017 in [facility] SURGICAL UNIT      Signed            Show:Clear all [x] Manual[x] Template[x] Copied  Added by: [x] Trish Mage, RN[x] Ranelle Oyster, MD   [] Hover for details     [employer] PMR Admission Coordinator Pre-Admission Assessment  Patient: Miguel Alvarez is an 67 y.o., male MRN: [id number] DOB: 12-Nov-1950 Height: 6\' 2"  (188 cm) Weight: 131.9 kg (290 lb 12.6 oz)  Insurance Information HMO: No   PPO:       PCP:       IPA:       80/20:       OTHER:   PRIMARY:  Medicare A and B      Policy#: [id number]      Subscriber:  patient CM Name:        Phone#:       Fax#:   Pre-Cert#:        Employer:   Benefits:  Phone #:       Name: Checked in Passport One Source Eff. Date: 02/18/16     Deduct:  $1364      Out of Pocket Max:  None      Life Max: N/A CIR: 100%      SNF: 100 days Outpatient: 80%     Co-Pay: 20% Home Health: 100%      Co-Pay: none DME: 80%     Co-Pay: 20% Providers: patient's choice  SECONDARY:  TEFL teacher Supplement      Policy#: [id number]      Subscriber:  patient CM Name:        Phone#:       Fax#:   Pre-Cert#:        Employer:   Benefits:  Phone #:       Name:   Eff. Date: 02/18/16      Deduct:        Out of Pocket Max:        Life Max:   CIR:        SNF:   Outpatient:       Co-Pay:   Home Health:        Co-Pay:   DME:       Co-Pay:    Emergency Contact Information         Contact Information    Name Relation Home Work Mobile   Miguel Alvarez,Miguel Alvarez Spouse [phone]  [phone]      Current Medical History  Patient Admitting Diagnosis: L cerebellar CVA  History of Present Illness: A 67 year old right-handed male with history of hypertension and hyperlipidemia as well as atrial fibrillation  maintained on Eliquis and remote tobacco abuse.Per chart review patient lives with spouse. Independent prior to admission. One level home with basement. 3 steps to entry. Patient has a supportive family.Presented to [facility] 12/02/2017 with dizziness, blurred vision and fall while getting up to the bathroom. Cranial CT scan negative for acute changes. Patient did not receive TPA. 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 Carotid Dopplers with mild bilateral carotid bifurcation and proximal ICA plaque, resulting in less than 50% diameter stenosis. Echocardiogram with ejection fraction 65% no wall motion abnormalities. Neurology consulted presently on aspirin for CVA prophylaxis as well as Eliquis.Tolerating a regular diet. Physical and occupational therapy evaluations completed. Patient was admitted for a comprehensive rehab program.  Patient's medical record from  [facility] has been reviewed by the rehabilitation admission coordinator and physician.  NIH Stroke scale: 5  Past Medical History      Past Medical History:  Diagnosis Date  . Arthritis    osteoarthritis  . Atrial fibrillation (HCC)   . GERD (gastroesophageal reflux disease)     Family History   family history is not on file.  Prior Rehab/Hospitalizations Has the patient had major surgery during 100 days prior to admission? No               Current Medications See MAR from [facility]  Patients Current Diet:   Heart healthy, thin liquids  Precautions / Restrictions Precautions Precautions: Fall Restrictions Weight Bearing Restrictions: No    Has the patient had 2 or more falls or a fall with injury in the past year?No  Prior Activity Level Community (5-7x/wk): Went out daily, was driving.  Prior Functional Level Self Care: Did the patient need help bathing, dressing, using the toilet or eating?  Independent  Indoor Mobility: Did the patient need assistance with walking from room to room (with or without device)? Independent  Stairs: Did the patient need assistance with internal or external stairs (with or without device)? Independent  Functional Cognition: Did the patient need help planning regular tasks such as shopping or remembering to take medications? Independent  Home Assistive Devices / Equipment Home Assistive Devices/Equipment: None Home Equipment: None  Prior Device Use: Indicate devices/aids used by the patient prior to current illness, exacerbation or injury? None   Prior Functional Level Current Functional Level  Bed Mobility  Independent  Mod Independent   Transfers  Independent  Mod assist(Min to mod assist for transfers.)   Mobility - Walk/Wheelchair  Independent  Mod assist(Ambulated min to mod assist 55 feet RW)   Upper Body Dressing  Independent    Lower Body Dressing  Independent  Other(Set up to donn socks)   Grooming  Independent    Eating/Drinking  Independent    Toilet Transfer  Independent  Min assist   Bladder Continence   WDL  Voiding in urinal   Bowel Management  WDL  Last BM 12/02/17   Stair Climbing  Independent Other(Not tried)   Tax inspector  Verbal   Memory  WDL  WDL   Cooking/Meal Prep  Independent      Housework  Independent    Money Management  Independent    Driving  Independent               Special needs/care consideration BiPAP/CPAP No CPM No Continuous Drip IV 0.9% NS 50 mL/hr Dialysis No       Life Vest No Oxygen No Special Bed No Trach Size  No Wound Vac (area) No       Skin Bruises easily                           Bowel mgmt: Last BM 12/03/17 Bladder mgmt: Voiding in urinal Diabetic mgmt No  Previous Home Environment Living Arrangements:  Spouse/significant other Available Help at Discharge: Family Type of Home: House Home Layout: Laundry or work area in basement, Able to live on main level with bedroom/bathroom Home Access: Stairs to enter Entrance Stairs-Rails: None Secretary/administrator of Steps: 3 Bathroom Shower/Tub: Hydrographic surveyor, Engineer, building services: Pharmacist, community: Yes Home Care Services: No  Discharge Living Setting Plans for Discharge Living Setting: House, Lives with (comment)(Lives with Alvarez.) Type of Home at Discharge: House Discharge Home Layout: Two level, Laundry or work area in basement, Able to live on main level with bedroom/bathroom Alternate Level Stairs-Number of Steps: Flight Discharge Home Access: Stairs to enter Entrance Stairs-Rails: None Entrance Stairs-Number of Steps: 3 steps Does the patient have any problems obtaining your medications?: No  Social/Family/Support Systems Patient Roles: Spouse(Has a Alvarez.) Contact Information: Miguel Alvarez - Alvarez Anticipated Caregiver: Alvarez Anticipated Caregiver's Contact Information: Miguel Alvarez - [phone] Ability/Limitations of Caregiver: Alvarez does not work and can assist. Medical laboratory scientific officer: 24/7 Discharge Plan Discussed with Primary Caregiver: Yes Is Caregiver In Agreement with Plan?: Yes Does Caregiver/Family have Issues with Lodging/Transportation while Pt is in Rehab?: No  Goals/Additional Needs Patient/Family Goal for Rehab: PT/OT supervision goals Expected length of stay: 10-14 days Cultural Considerations: None Dietary Needs: Heart healthy, thin liquids Equipment Needs: TBD Pt/Family Agrees to Admission and willing to participate: Yes Program Orientation Provided & Reviewed with Pt/Caregiver  Including Roles  & Responsibilities: Yes Additional Information Needs:       Patient Condition: I have reviewed all clinical records.  I have spoken with patient and his Alvarez by phone.  Patient can tolerate and will benefit from 3 hours of therapy a day to regain his functional independence.  He needs the coordinated approach of the rehab team to achieve the above goal.  I have shared all information with rehab MD and I have approval for acute inpatient rehab admission.  Preadmission Screen Completed By:  Trish Mage, 12/04/2017 10:34 AM ______________________________________________________________________   Discussed status with Dr. Riley Kill on 12/04/17 at 1036 and received telephone approval for admission today.  Admission Coordinator:  Trish Mage, time 1036/Date 12/04/17   Assessment/Plan: Diagnosis: left cerebellar CVA 1. Does the need for close, 24 hr/day  Medical supervision in concert with the patient's rehab needs make it unreasonable for this patient to be served in a less intensive setting? Yes 2. Co-Morbidities requiring supervision/potential complications: a fib, htn, gerd, post-stroke sequelae 3. Due to bladder management, bowel management, safety, skin/wound care, disease management, medication administration, pain management and patient education, does the patient require 24 hr/day rehab nursing? Yes 4. Does the patient require coordinated care of a physician, rehab nurse, PT (1-2 hrs/day, 5 days/week) and OT (1-2 hrs/day, 5 days/week) to address physical and functional deficits in the context of the above medical diagnosis(es)? Yes Addressing deficits in the following areas: balance, endurance, locomotion, strength, transferring, bowel/bladder control, bathing, dressing, feeding, grooming, toileting and psychosocial support 5. Can the patient actively participate in an intensive therapy program of at least 3 hrs of therapy 5 days a week? Yes 6. The potential for patient  to make measurable gains while on inpatient rehab is excellent 7. Anticipated functional outcomes upon discharge from inpatients are: modified independent and supervision PT, modified independent and supervision OT, n/a SLP 8. Estimated rehab length of stay to reach the above functional goals is: 10-14 days 9. Does the patient have adequate social supports to accommodate these discharge functional goals? Yes 10. Anticipated D/C setting: Home 11. Anticipated post D/C  treatments: HH therapy and Outpatient therapy 12. Overall Rehab/Functional Prognosis: excellent    RECOMMENDATIONS: This patient's condition is appropriate for continued rehabilitative care in the following setting: CIR Patient has agreed to participate in recommended program. Yes Note that insurance prior authorization may be required for reimbursement for recommended care.  Comment: Admit to inpatient rehab today  Ranelle OysterZachary T. Swartz, MD, [facility] Specialty HospitalFAAPMR [facility] 12/04/2017   Trish MageLogue, Joyce Heitman M 12/04/2017          Revision History

## 2017-12-05 NOTE — Progress Notes (Signed)
Social Work  Social Work Assessment and Plan  Patient Details  Name: Miguel Alvarez MRN: [id number] Date of Birth: 01/26/1951  Today's Date: 12/05/2017  Problem List:  Patient Active Problem List   Diagnosis Date Noted  . Cerebellar infarct (HCC) 12/05/2017  . Dizziness 12/02/2017  . Hyperlipidemia 12/02/2017  . Acute CVA (cerebrovascular accident) (HCC) 12/02/2017  . Atrial fibrillation, chronic (HCC) 12/02/2017  . Mucosal abnormality of esophagus   . Diverticulosis of colon without hemorrhage   . Blood in stool   . Barrett's esophagus 09/03/2015  . GERD (gastroesophageal reflux disease) 09/03/2015  . Normocytic anemia 09/03/2015  . Heme positive stool 09/03/2015   Past Medical History:  Past Medical History:  Diagnosis Date  . Arthritis    osteoarthritis  . Atrial fibrillation (HCC)   . GERD (gastroesophageal reflux disease)    Past Surgical History:  Past Surgical History:  Procedure Laterality Date  . COLONOSCOPY  pandya   6-7 years ago, some polyps  . COLONOSCOPY WITH PROPOFOL N/A 10/28/2015   Procedure: COLONOSCOPY WITH PROPOFOL;  Surgeon: Corbin Ade, MD;  Location: [facility];  Service: Endoscopy;  Laterality: N/A;  0730  . ESOPHAGOGASTRODUODENOSCOPY     Barrett's esophagus per patient  . ESOPHAGOGASTRODUODENOSCOPY (EGD) WITH PROPOFOL N/A 10/28/2015   Procedure: ESOPHAGOGASTRODUODENOSCOPY (EGD) WITH PROPOFOL;  Surgeon: Corbin Ade, MD;  Location: [facility];  Service: Endoscopy;  Laterality: N/A;  . removal of bone foot Left    age 67   Social History:  reports that he quit smoking about 10 years ago. His smoking use included cigarettes. He has a 40.00 pack-year smoking history. He has never used smokeless tobacco. He reports that he drank about 7.2 oz of alcohol per week. He reports that he does not use drugs.  Family / Support Systems Marital Status: Married Patient Roles: Spouse, Parent, Other (Comment)(retiree) Spouse/Significant Other:  [name]-wife  [phone] Other Supports: Friends and Church members Anticipated Caregiver: Wife Ability/Limitations of Caregiver: Wife is retired and can assist-in good Hotel manager Availability: 24/7 Family Dynamics: Close knit family who are there for one another, wife is in good health and can assist pt. Pt really wants to reach mod/i level and not have to rely upon wife to assist. He is one who has always been independent and taken care of himself.  Social History Preferred language: English Religion:  Cultural Background: No issues Education: college educated Read: Yes Write: Yes Employment Status: Retired Fish farm manager Issues: No issues Guardian/Conservator: None-according to MD pt is capable of making his own decisions while here   Abuse/Neglect Abuse/Neglect Assessment Can Be Completed: Yes Physical Abuse: Denies Verbal Abuse: Denies Sexual Abuse: Denies Exploitation of patient/patient's resources: Denies Self-Neglect: Denies  Emotional Status Pt's affect, behavior adn adjustment status: Pt is motivated to improve and feels fortunate the stroke was not worse. He does have issues with dizziness and blurred vision which is improving and he is hopeful will continue to improve. He is glad to be here on the rehab unit it is where she feels he needs to be. Recent Psychosocial Issues: other health issues were being managed by PCP Pyschiatric History: No history deferred depression screen due to coping appropriately, will monitor while here and see if would benefit from seeing neuro-psych while here Substance Abuse History: No issues  Patient / Family Perceptions, Expectations & Goals Pt/Family understanding of illness & functional limitations: Pt and wife can explain his stroke and deficits. Both do talk with the MD and  feel he is listening and answers their questions. Wife plans to be here daily to see his progress and provide  support. Premorbid pt/family roles/activities: husband, father, retiree, golfer, church member, etc Anticipated changes in roles/activities/participation: resume Pt/family expectations/goals: Pt states: " I want to be able to take care of myself before I leave here. I am lucky it could have been worse."  Wife states: " I will do what he needs but he wants to do it on his own."  [employer] IncCommunity Resources Community Agencies: None Premorbid Home Care/DME Agencies: None Transportation available at discharge: Wife Resource referrals recommended: Support group (specify)  Discharge Planning Living Arrangements: Spouse/significant other Support Systems: Spouse/significant other, Children, Friends/neighbors Type of Residence: Private residence Community education officernsurance Resources: Harrah's EntertainmentMedicare, Media plannerrivate Insurance (specify)(Aetna) Financial Screen Referred: No Living Expenses: Own Money Management: Patient, Spouse Does the patient have any problems obtaining your medications?: No Home Management: Both he does the outside work Patient/Family Preliminary Plans: Return home with wife who is able to assist if needed. Pt does not want her to have to assist him at discharge and will do his best so she doesn't have too. Awaiting therapy team evaluations for goals and target discharge date. Social Work Anticipated Follow Up Needs: HH/OP, Support Group  Clinical Impression Pleasant motivated gentleman who is grateful his stroke was not worse and is ready to work and regain his independence before going home. Will await team's goals and work on best plan for him. Supportive wife who is here daily to provide support and attend therapies with.  Lucy Chrisupree, Drago Hammonds G 12/05/2017, 3:29 PM

## 2017-12-05 NOTE — Evaluation (Signed)
Physical Therapy Assessment and Plan  Patient Details  Name: Miguel Alvarez MRN: [id number] Date of Birth: 1951-06-12  PT Diagnosis: Abnormal posture, Abnormality of gait, Coordination disorder, Hemiplegia non-dominant, Low back pain and Muscle weakness Rehab Potential: Good ELOS: 10-14 days    Today's Date: 12/05/2017 PT Individual Time: 0905-1020 AND 1610-1705 PT Individual Time Calculation (min): 75 min  AND 55 min   Problem List:  Patient Active Problem List   Diagnosis Date Noted  . Cerebellar infarct ([place]) 12/05/2017  . Dizziness 12/02/2017  . Hyperlipidemia 12/02/2017  . Acute CVA (cerebrovascular accident) ([place]) 12/02/2017  . Atrial fibrillation, chronic ([place]) 12/02/2017  . Mucosal abnormality of esophagus   . Diverticulosis of colon without hemorrhage   . Blood in stool   . Barrett's esophagus 09/03/2015  . GERD (gastroesophageal reflux disease) 09/03/2015  . Normocytic anemia 09/03/2015  . Heme positive stool 09/03/2015    Past Medical History:  Past Medical History:  Diagnosis Date  . Arthritis    osteoarthritis  . Atrial fibrillation ([place])   . GERD (gastroesophageal reflux disease)    Past Surgical History:  Past Surgical History:  Procedure Laterality Date  . COLONOSCOPY  pandya   6-7 years ago, some polyps  . COLONOSCOPY WITH PROPOFOL N/A 10/28/2015   Procedure: COLONOSCOPY WITH PROPOFOL;  Surgeon: Daneil Dolin, MD;  Location: [facility];  Service: Endoscopy;  Laterality: N/A;  0730  . ESOPHAGOGASTRODUODENOSCOPY     Barrett's esophagus per patient  . ESOPHAGOGASTRODUODENOSCOPY (EGD) WITH PROPOFOL N/A 10/28/2015   Procedure: ESOPHAGOGASTRODUODENOSCOPY (EGD) WITH PROPOFOL;  Surgeon: Daneil Dolin, MD;  Location: [facility];  Service: Endoscopy;  Laterality: N/A;  . removal of bone foot Left    age 47    Assessment & Plan Clinical Impression: Patient is a 67 year old right-handed male with history of hypertension and hyperlipidemia as well as  atrial fibrillation maintained on Eliquis and remote tobacco abuse.Per chart review patient lives with spouse. Independent prior to admission. One level home with basement. 3 steps to entry. Patient has a supportive family.Presented to [facility]2017 with dizziness, blurred vision and fall while getting up to the bathroom. Cranial CT scan negative for acute changes. Patient did not receive TPA. 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 Carotid Dopplers with mild bilateral carotid bifurcation and proximal ICA plaque, resulting in less than 50% diameter stenosis. Echocardiogram with ejection fraction 65% no wall motion abnormalities. Neurology consulted presently on aspirin for CVA prophylaxis as well as Eliquisand plan discontinuation of aspirin after 3 months.   Patient transferred to CIR on 12/04/2017 .   Patient currently requires mod with mobility secondary to muscle weakness, impaired timing and sequencing, ataxia and decreased coordination, decreased visual acuity, decreased visual perceptual skills and decreased visual motor skills, decreased safety awareness and decreased sitting balance, decreased standing balance, decreased postural control, hemiplegia and decreased balance strategies.  Prior to hospitalization, patient was independent  with mobility and lived with Spouse in a House home.  Home access is 3Stairs to enter.  Patient will benefit from skilled PT intervention to maximize safe functional mobility, minimize fall risk and decrease caregiver burden for planned discharge home with 24 hour supervision.  Anticipate patient will benefit from  follow up [place] at discharge.  PT - End of Session Activity Tolerance: Tolerates 10 - 20 min activity with multiple rests Endurance Deficit: Yes PT Assessment Rehab Potential (ACUTE/IP ONLY): Good PT Barriers to Discharge: Inaccessible home environment;Medical stability;Weight PT Patient demonstrates impairments in the following area(s): Balance;Behavior;Endurance;Motor;Safety;Sensory;Pain PT Transfers Functional Problem(s): Bed Mobility;Bed to Chair;Car;Furniture;Floor;Other (comment) PT Locomotion Functional Problem(s): Ambulation;Wheelchair Mobility;Stairs PT Plan PT Intensity: Minimum of 1-2 x/day ,45 to 90 minutes PT Frequency: 5 out of 7 days PT Duration Estimated Length of Stay: 10-14 days  PT Treatment/Interventions: Ambulation/gait training;Balance/vestibular training;Cognitive remediation/compensation;Community reintegration;Discharge planning;DME/adaptive equipment instruction;Neuromuscular re-education;Pain management;Patient/family education;Functional mobility training;Skin care/wound management;Psychosocial support;Stair training;Splinting/orthotics;Therapeutic Exercise;Therapeutic Activities;UE/LE Strength taining/ROM;Visual/perceptual remediation/compensation;Wheelchair propulsion/positioning;UE/LE Coordination activities PT Transfers Anticipated Outcome(s): Supervision assist with LRAD  PT Locomotion Anticipated Outcome(s): Ambulatory at household level with LRAD  PT Recommendation Recommendations for Other Services: Therapeutic Recreation consult;Neuropsych consult Therapeutic Recreation Interventions: Stress management;Outing/community reintergration Follow Up Recommendations: Home health PT Patient destination: Home Equipment Recommended: Rolling walker with 5" wheels;Wheelchair (measurements);Wheelchair cushion (measurements)  Skilled Therapeutic Intervention Session 1. PT instructed patient in PT Evaluation and initiated treatment intervention; see below for details. PT  educated patient in [place], rehab potential, rehab goals, and discharge recommendations. in addition to basic transfers, Pt instructed in Car transfer training with RW and min assist from PTfor safety. Stair management and gait training as listed below as well as additional gait training with RW x 266f with min assist fading to mod assist with fatigue.  [facility] and all needs met.    Session 2. Pt received sitting in WC and agreeable to PT.  WC mobility through hall of rehab unit with supervision assist from PT, 2 x 1573f PT required to provide min cues for improved turning technique and improved equal use of BUE to maintain straight path.   PT instructed pt in Gait training with RW x 15052fith min assist from PT with intermittent Mod assist with turns to the R. Dynamic gaitt training over unlevel surface x 27f31fth RW and min assist from PT; weave through 4 cones x 4 with UE support on RW and min assist form PT. Moderate cues from PT for Ad management and improved step length on the LLE to normalize gait pattern and improve safety.   PT instructed pt in Berg balance test. Patient demonstrates increased fall risk as noted by score of  32/56 on Berg Balance Scale.  (<36= high risk for falls, close to 100%; 37-45 significant >80%; 46-51 moderate >50%; 52-55 lower >25%).   Patient returned to room and [facility] and all needs met.           PT Evaluation Precautions/Restrictions   General   Vital SignsTherapy Vitals Pulse Rate: 71 BP: (!) 169/86 Patient Position (if appropriate): Standing Pain Pain Assessment Pain Scale: 0-10 Pain Score: 6  Pain Location: Head Pain Orientation: Left Pain Descriptors / Indicators: Aching Home Living/Prior Functioning Home Living Available Help at Discharge: Family Type of Home: House Home Access: Stairs to enter EntrTechnical brewerSteps: 3 Entrance  Stairs-Rails: None Home Layout: Laundry or work area in basement;Able to live on main level with bedroom/bathroom Bathroom Shower/Tub: Tub/Product/process development scientistanAssociate Professoressibility: Yes  Lives With: Spouse Prior Function Level of Independence: Independent with basic ADLs;Independent with homemaking with ambulation  Able to Take Stairs?: Yes Driving: Yes  Vocation: Retired Comments: golf, yard work, 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 has subsided at eval) Perception Perception: Impaired Praxis Praxis: Intact  Cognition Overall Cognitive Status: Within Functional Limits for tasks assessed Orientation Level: Oriented X4 Memory: Appears intact Awareness: Impaired Problem Solving: Impaired Safety/Judgment: Impaired Sensation Sensation Light Touch: Appears Intact Stereognosis: Impaired by gross assessment(LUE>LLE ) Coordination Gross Motor Movements are Fluid and Coordinated: No Fine Motor Movements are Fluid and Coordinated: No Coordination and Movement Description: Ataxia in the LUE>LLE Finger Nose Finger Test: ataxic Heel Shin Test: Ataxia in the LLE Motor  Motor Motor: Hemiplegia;Ataxia Motor - Skilled Clinical Observations: mild UE hemiplegia. Ataxia in the LLE and LUE  Mobility Bed Mobility Bed Mobility: Supine to Sit;Rolling Right;Rolling Left;Sit to Supine Rolling Right: Supervision/verbal cueing Rolling Left: Supervision/Verbal cueing Supine to Sit: Supervision/Verbal cueing Sit to Supine: Supervision/Verbal cueing Transfers Transfers: Sit to Stand;Stand Pivot Transfers Sit to Stand: Minimal Assistance - Patient > 75% Stand Pivot Transfers: Minimal Assistance - Patient > 75% Stand Pivot Transfer Details: Verbal  cues for technique;Verbal cues for precautions/safety;Verbal cues for safe use of DME/AE Locomotion  Gait Ambulation: Yes Gait Assistance: Moderate Assistance - Patient 50-74% Assistive device: Rolling walker Gait Assistance Details: Verbal cues for gait pattern;Manual facilitation for weight shifting;Verbal cues for safe use of DME/AE;Verbal cues for precautions/safety;Visual cues/gestures for precautions/safety;Tactile cues for weight shifting Gait Gait: Yes Gait Pattern: Impaired Gait Pattern: Narrow base of support;Decreased stance time - left;Lateral trunk lean to left Stairs / Additional Locomotion Stairs: Yes Stairs Assistance: Minimal Assistance - Patient > 75% Stair Management Technique: Two rails Number of Stairs: 12 Height of Stairs: 6 Wheelchair Mobility Wheelchair Mobility: Yes Wheelchair Assistance: Minimal assistance - Patient >75% Wheelchair Propulsion: Both upper extremities Wheelchair Parts Management: Needs assistance Distance: 156f   Trunk/Postural Assessment  Cervical Assessment Cervical Assessment: Within Functional Limits Thoracic Assessment Thoracic Assessment: Exceptions to WFL(rounded shoulders) Lumbar Assessment Lumbar Assessment: Exceptions to WFL(decreased lordosis) Postural Control Postural Control: Deficits on evaluation(delayed righting reactions in standing )  Balance Standardized Balance Assessment Standardized Balance Assessment: Berg Balance Test Berg Balance Test Sit to Stand: Able to stand  independently using hands Standing Unsupported: Able to stand 2 minutes with supervision Sitting with Back Unsupported but Feet Supported on Floor or Stool: Able to sit safely and securely 2 minutes Stand to Sit: Controls descent by using hands Transfers: Able to transfer safely, definite need of hands Standing Unsupported with Eyes Closed: Able to stand 10 seconds with supervision Standing Ubsupported with Feet Together: Able to place feet together  independently and stand for 1 minute with supervision From Standing, Reach Forward with Outstretched Arm: Can reach forward >12 cm safely (5") From Standing Position, Pick up Object from Floor: Unable to try/needs assist to keep balance From Standing Position, Turn to Look Behind Over each Shoulder: Looks behind one side only/other side shows less weight shift Turn 360 Degrees: Needs assistance while turning Standing Unsupported, Alternately Place Feet on Step/Stool: Able to complete >2 steps/needs minimal assist Standing Unsupported, One Foot in Front: Able to take small step independently and hold 30 seconds Standing on One Leg: Tries to lift leg/unable to hold 3 seconds but remains standing independently Total Score: 32 Dynamic Sitting Balance Dynamic Sitting - Level of Assistance: 5: Stand by assistance Static Standing Balance Static Standing - Level of Assistance: 4:  Min assist Dynamic Standing Balance Dynamic Standing - Level of Assistance: 3: Mod assist Extremity Assessment      RLE Assessment RLE Assessment: Within Functional Limits LLE Assessment LLE Assessment: Within Functional Limits   See Function Navigator for Current Functional Status.   Refer to Care Plan for Long Term Goals  Recommendations for other services: Therapeutic Recreation  Stress management and Outing/community reintegration  Discharge Criteria: Patient will be discharged from PT if patient refuses treatment 3 consecutive times without medical reason, if treatment goals not met, if there is a change in medical status, if patient makes no progress towards goals or if patient is discharged from hospital.  The above assessment, treatment plan, treatment alternatives and goals were discussed and mutually agreed upon: by patient and by family  Lorie Phenix 12/05/2017, 10:52 AM

## 2017-12-05 NOTE — Patient Care Conference (Signed)
Inpatient RehabilitationTeam Conference and Plan of Care Update Date: 12/05/2017   Time: 11:55 AM    Patient Name: Miguel Alvarez      Medical Record Number: [id number]  Date of Birth: 1950/07/02 Sex: Male         Room/Bed: 4M07C/4M07C-01 Payor Info: Payor: MEDICARE / Plan: MEDICARE PART A AND B / Product Type: *No Product type* /    Admitting Diagnosis: L CVA  Admit Date/Time:  12/04/2017  3:33 PM Admission Comments: No comment available   Primary Diagnosis:  <principal problem not specified> Principal Problem: <principal problem not specified>  Patient Active Problem List   Diagnosis Date Noted  . Cerebellar infarct ([facility]) 12/05/2017  . Dizziness 12/02/2017  . Hyperlipidemia 12/02/2017  . Acute CVA (cerebrovascular accident) ([facility]) 12/02/2017  . Atrial fibrillation, chronic ([facility]) 12/02/2017  . Mucosal abnormality of esophagus   . Diverticulosis of colon without hemorrhage   . Blood in stool   . Barrett's esophagus 09/03/2015  . GERD (gastroesophageal reflux disease) 09/03/2015  . Normocytic anemia 09/03/2015  . Heme positive stool 09/03/2015    Expected Discharge Date: Expected Discharge Date: 12/14/17  Team Members Present: Physician leading conference: Dr. Claudette Laws Social Worker Present: Dossie Der, LCSW Nurse Present: Kennon Portela, RN PT Present: Grier Rocher, PT;Rosita Dechalus, PTA OT Present: Rosalio Loud, OT PPS Coordinator present : Tora Duck, RN, CRRN     Current Status/Progress Goal Weekly Team Focus  Medical   cerebellar infarct, afib, low back pain, htn  improve balance  bp control, mgt of low back pain, stroke sequelae   Bowel/Bladder     Cont B & B        Swallow/Nutrition/ Hydration             ADL's   min assist bathroom transfers with RW, LUE weakness and dysmetria, pt reports wife assisted with LB dressing  Supervision overall  ADL retraining, safety awareness, LUE NMR   Mobility     eval pending        Communication              Safety/Cognition/ Behavioral Observations            Pain     headache treating with tylenol and MD to prescribe stronger med        Skin     no skin issues           *See Care Plan and progress notes for long and short-term goals.     Barriers to Discharge  Current Status/Progress Possible Resolutions Date Resolved   Physician    Medical stability               Nursing                  PT  Inaccessible home environment;Medical stability;Weight                 OT                  SLP                SW                Discharge Planning/Teaching Needs:    Home with wife who can provide supervision level. Pt is doing well and wants to go home asap.     Team Discussion:  Goals . Being evaluated today. Cont B & B. Currently mod assist level. Double vision better today.  HOH and dizziness working on also.  Revisions to Treatment Plan: New eval 10-14 days ELOS    Continued Need for Acute Rehabilitation Level of Care: The patient requires daily medical management by a physician with specialized training in physical medicine and rehabilitation for the following conditions: Daily direction of a multidisciplinary physical rehabilitation program to ensure safe treatment while eliciting the highest outcome that is of practical value to the patient.: Yes Daily medical management of patient stability for increased activity during participation in an intensive rehabilitation regime.: Yes Daily analysis of laboratory values and/or radiology reports with any subsequent need for medication adjustment of medical intervention for : Neurological problems  Aubrie Lucien, Lemar LivingsRebecca G 12/06/2017, 9:44 AM

## 2017-12-05 NOTE — Progress Notes (Signed)
[facility] PHYSICAL MEDICINE & REHABILITATION     PROGRESS NOTE    Subjective/Complaints: Had a reasonable night. Developed some back pain after he moved awkwardly in bed this morning.   ROS: Patient denies fever, rash, sore throat, blurred vision, nausea, vomiting, diarrhea, cough, shortness of breath or chest pain, joint or back pain, headache, or mood change.   Objective:  [place] Carotid Bilateral (at [facility] Only)  Result Date: 12/03/2017 CLINICAL DATA:  Stroke, left facial droop. Coronary artery disease, previous tobacco abuse. EXAM: BILATERAL CAROTID DUPLEX ULTRASOUND TECHNIQUE: Wallace CullensGray scale imaging, color Doppler and duplex ultrasound was performed of bilateral carotid and vertebral arteries in the neck. COMPARISON:  None. TECHNIQUE: Quantification of carotid stenosis is based on velocity parameters that correlate the residual internal carotid diameter with NASCET-based stenosis levels, using the diameter of the distal internal carotid lumen as the denominator for stenosis measurement. The following velocity measurements were obtained: PEAK SYSTOLIC/END DIASTOLIC RIGHT ICA:                     87/13cm/sec CCA:                     70/10cm/sec SYSTOLIC ICA/CCA RATIO:  1.2 ECA:                     107cm/sec LEFT ICA:                     97/28cm/sec CCA:                     103/19cm/sec SYSTOLIC ICA/CCA RATIO:  1.4 ECA:                     205cm/sec FINDINGS: RIGHT CAROTID ARTERY: Eccentric partially calcified plaque in the common carotid artery and bulb extending to ICA origin, without high-grade stenosis. Normal waveforms and color Doppler signal. RIGHT VERTEBRAL ARTERY:  Normal flow direction and waveform. LEFT CAROTID ARTERY: Partially calcified plaque in the bulb and proximal ICA. No high-grade stenosis. Normal waveforms and color Doppler signal. LEFT VERTEBRAL ARTERY: Normal flow direction and waveform. IMPRESSION: 1. Mild bilateral carotid bifurcation and proximal ICA plaque, resulting in less  than 50% diameter stenosis. 2.  Antegrade bilateral vertebral arterial flow. Electronically Signed   By: Corlis Leak  Hassell M.D.   On: 12/03/2017 14:10   Recent Labs    12/05/17 0709  WBC 10.2  HGB 13.4  HCT 41.9  PLT 271   Recent Labs    12/05/17 0709  NA 142  K 4.3  CL 109  GLUCOSE 128*  BUN 15  CREATININE 1.07  CALCIUM 9.5   CBG (last 3)  No results for input(s): GLUCAP in the last 72 hours.  Wt Readings from Last 3 Encounters:  12/04/17 129 kg (284 lb 6.3 oz)  12/02/17 131.9 kg (290 lb 12.6 oz)  10/28/15 (!) 136.5 kg (301 lb)     Intake/Output Summary (Last 24 hours) at 12/05/2017 0923 Last data filed at 12/05/2017 0800 Gross per 24 hour  Intake 480 ml  Output -  Net 480 ml    Vital Signs: Blood pressure (!) 146/81, pulse (!) 54, temperature 98.3 F (36.8 C), temperature source Oral, resp. rate 18, height 6\' 2"  (1.88 m), weight 129 kg (284 lb 6.3 oz), SpO2 97 %. Physical Exam:  Constitutional: No distress . Vital signs reviewed. HEENT: EOMI, oral membranes moist Neck: supple Cardiovascular: IRR, IRR  without murmur. No JVD    Respiratory: CTA Bilaterally without wheezes or rales. Normal effort    GI: BS +, non-tender, non-distended  Neurological: He isalert. Patient is alert. Speech is fluent. No dysarthria. Follows simple commands. Horizontal nystagmus with left and right gaze, symptomatic with left gaze. Mild left-sided facial sensory loss, upper quadrant. Mild dysmetria LUE and LLE.  Skin: Skin iswarm.  Psychiatric: Cooperative, flat affect    Assessment/Plan: 1. 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 Physiatrist is providing close team supervision and 24 hour management of active medical problems listed below. Physiatrist and rehab team continue to assess barriers to discharge/monitor patient progress toward functional and medical  goals.  Function:  Bathing Bathing position      Bathing parts      Bathing assist        Upper Body Dressing/Undressing Upper body dressing                    Upper body assist        Lower Body Dressing/Undressing Lower body dressing                                  Lower body assist        Toileting Toileting          Toileting assist     Transfers Chair/bed transfer             Locomotion Ambulation           Wheelchair          Cognition Comprehension    Expression    Social Interaction    Problem Solving    Memory    Medical Problem List and Plan: 1.Decreased functional mobilitysecondary toacute posterior fossa infarct centered in the left middle cerebellar peduncle -begin therapies today 2. DVT Prophylaxis/Anticoagulation: Eliquis. Monitor for any bleeding episodes 3. Pain Management:Tylenol as needed  -heat/ice for low back. Observe for now  -add muscle relaxant prn 4. Mood:Provide emotional support 5. Neuropsych: This patientiscapable of making decisions on hisown behalf. 6. Skin/Wound Care:Routine skin checks 7. Fluids/Electrolytes/Nutrition:good po  -I personally reviewed the patient's labs today.   8.Hypertension. Norvasc 5 mg daily. Monitor with increased mobility 9.Atrial fibrillation. Cardiac rate controlled at present. Continue Eliquis 10.Hyperlipidemia. Lipitor 11.GERD. Protonix     LOS (Days) 1 A FACE TO FACE EVALUATION WAS PERFORMED  Ranelle Oyster, MD 12/05/2017 9:23 AM

## 2017-12-05 NOTE — Evaluation (Signed)
Occupational Therapy Assessment and Plan  Patient Details  Name: Miguel Alvarez MRN: [id number] Date of Birth: 1950/11/25  OT Diagnosis: disturbance of vision, hemiplegia affecting non-dominant side, lumbago (low back pain) and muscle weakness (generalized) Rehab Potential: Rehab Potential (ACUTE ONLY): Good ELOS: 10-12 days   Today's Date: 12/05/2017 OT Individual Time: 8916-9450 OT Individual Time Calculation (min): 60 min     Problem List:  Patient Active Problem List   Diagnosis Date Noted  . Cerebellar infarct ([place]) 12/05/2017  . Dizziness 12/02/2017  . Hyperlipidemia 12/02/2017  . Acute CVA (cerebrovascular accident) ([place]) 12/02/2017  . Atrial fibrillation, chronic ([place]) 12/02/2017  . Mucosal abnormality of esophagus   . Diverticulosis of colon without hemorrhage   . Blood in stool   . Barrett's esophagus 09/03/2015  . GERD (gastroesophageal reflux disease) 09/03/2015  . Normocytic anemia 09/03/2015  . Heme positive stool 09/03/2015    Past Medical History:  Past Medical History:  Diagnosis Date  . Arthritis    osteoarthritis  . Atrial fibrillation ([place])   . GERD (gastroesophageal reflux disease)    Past Surgical History:  Past Surgical History:  Procedure Laterality Date  . COLONOSCOPY  pandya   6-7 years ago, some polyps  . COLONOSCOPY WITH PROPOFOL N/A 10/28/2015   Procedure: COLONOSCOPY WITH PROPOFOL;  Surgeon: Daneil Dolin, MD;  Location: [facility];  Service: Endoscopy;  Laterality: N/A;  0730  . ESOPHAGOGASTRODUODENOSCOPY     Barrett's esophagus per patient  . ESOPHAGOGASTRODUODENOSCOPY (EGD) WITH PROPOFOL N/A 10/28/2015   Procedure: ESOPHAGOGASTRODUODENOSCOPY (EGD) WITH PROPOFOL;  Surgeon: Daneil Dolin, MD;  Location: [facility];  Service: Endoscopy;  Laterality: N/A;  . removal of bone foot Left    age 25    Assessment & Plan Clinical Impression: Patient is a 67 y.o. right-handed male with history of hypertension and hyperlipidemia as well  as atrial fibrillation maintained on Eliquis and remote tobacco abuse.Per chart review patient lives with spouse. Independent prior to admission. One level home with basement. 3 steps to entry. Patient has a supportive family.[facility]ed to [facility]2017 with dizziness, blurred vision and fall while getting up to the bathroom. Cranial CT scan negative for acute changes. Patient did not receive TPA. 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 Carotid Dopplers with mild bilateral carotid bifurcation and proximal ICA plaque, resulting in less than 50% diameter stenosis. Echocardiogram with ejection fraction 65% no wall motion abnormalities. Neurology consulted presently on aspirin for CVA prophylaxis as well as Eliquisand plan discontinuation of aspirin after 3 months.Tolerating a regular diet. Physical and occupational therapy evaluations completed. Patient was admitted for a comprehensive rehab program.    Patient transferred to [facility] on 12/04/2017 .    Patient currently requires min with basic self-care skills secondary to muscle weakness, impaired timing and sequencing, ataxia and decreased coordination, decreased visual perceptual skills and decreased visual motor skills, decreased awareness, decreased problem solving, decreased safety awareness and decreased memory and decreased standing balance, hemiplegia and decreased balance strategies.  Prior to hospitalization, patient could complete ADLs with independent .  Patient will benefit from skilled intervention to increase independence with basic self-care skills prior to discharge home with care partner.  Anticipate patient will require intermittent supervision and follow up outpatient.  OT - End of Session Activity Tolerance: Tolerates 30+ min activity with multiple rests Endurance Deficit: Yes OT Assessment Rehab Potential (ACUTE ONLY): Good OT Patient demonstrates impairments in the following area(s): Balance;Endurance;Cognition;Motor;Pain;Sensory;Vision;Safety OT Basic ADL's Functional Problem(s): Grooming;Bathing;Dressing;Toileting OT Advanced ADL's Functional Problem(s): Simple Meal Preparation OT Transfers Functional Problem(s): Toilet;Tub/Shower OT Additional Impairment(s): Fuctional Use of Upper Extremity OT Plan OT Intensity: Minimum of 1-2 x/day, 45 to 90 minutes OT Frequency: 5 out of 7 days OT Duration/Estimated Length of Stay: 10-12 days OT Treatment/Interventions: Balance/vestibular training;Cognitive remediation/compensation;Community reintegration;Discharge planning;Disease mangement/prevention;DME/adaptive equipment instruction;Functional mobility training;Neuromuscular re-education;Pain management;Patient/family education;Psychosocial support;Self Care/advanced ADL retraining;Therapeutic Activities;Therapeutic Exercise;UE/LE Strength taining/ROM;UE/LE Coordination activities;Visual/perceptual remediation/compensation OT Self Feeding Anticipated Outcome(s): Mod I OT Basic Self-Care Anticipated Outcome(s): Supervision OT Toileting Anticipated Outcome(s): Supervision-Mod I OT Bathroom Transfers Anticipated Outcome(s): Supervision OT Recommendation Recommendations for Other Services: Therapeutic Recreation consult Therapeutic Recreation Interventions: Stress management;Outing/community reintergration Patient destination: Home Follow Up Recommendations: Outpatient OT Equipment Recommended: 3 in 1 bedside comode;Tub/shower bench   Skilled Therapeutic Intervention OT eval completed with discussion of rehab process, OT purpose, POC, ELOS, and goals.  Pt already dressed and  declined bathing/dressing at this time.  Agreeable to shower in the AM with therapy.  Engaged in transfer training in ADL apt with pt completing toilet transfer ambulating to toilet with RW with min assist and then completing transfer in/out of tub/shower with use of tub bench.  Discussed recommendation for use of tub bench and to have grab bars installed in tub/shower.  Engaged in 9 hole peg test and box and blocks assessment to further assess LUE impairments.  9 hole peg test Rt: 25 seconds and Lt: 45 seconds, Box and blocks assessment: Rt: 49 blocks and Lt: 37 blocks.    OT Evaluation Precautions/Restrictions  Precautions Precautions: Fall Pain Pain Assessment Pain Scale: 0-10 Pain Score: 0-No pain Home Living/Prior Functioning Home Living Family/patient expects to be discharged to:: Private residence Living Arrangements: Spouse/significant other Available Help at Discharge: Family Type of Home: House Home Access: Stairs to enter Technical brewer of Steps: 4-5 Entrance Stairs-Rails: Right Home Layout: Laundry or work area in basement, Able to live on main level with bedroom/bathroom Bathroom Shower/Tub: Public librarian, Architectural technologist: Programmer, systems: Yes  Lives With: Spouse Prior Function Level of Independence: Independent with basic ADLs, Independent with homemaking with ambulation, Independent with gait  Able to Take Stairs?: Yes Driving: Yes Vocation: Retired Comments: golf, yard work, Camera operator  ADL  See Solicitor Baseline Vision/History: Wears glasses Wears Glasses: Reading only Patient Visual Report: Diplopia;Blurring of vision(reports diplopia is resolving) Vision Assessment?: Yes Eye Alignment: Within Functional Limits Ocular Range of Motion: Within Functional Limits Alignment/Gaze Preference: Within Defined Limits Tracking/Visual Pursuits: Decreased smoothness of horizontal tracking;Impaired - to be further tested in  functional context(noted nystagmus when scanning to Rt) Saccades: Within functional limits Convergence: Within functional limits(reports increased blurriness with convergence) Visual Fields: No apparent deficits Diplopia Assessment: Objects split on top of one another(reports intermittent diplopia, resolving on eval) Additional Comments: Nystagmus noted in both eyes with scanning to end ranges on Rt and up/down Perception  Perception: Impaired Praxis Praxis: Intact Cognition Overall Cognitive Status: Within Functional Limits for tasks assessed Arousal/Alertness: Awake/alert Orientation Level: Person;Situation;Place Person: Oriented Place: Oriented Situation: Oriented Year: 2019 Month: June Day of Week: Correct Memory: Appears intact Immediate Memory Recall: Sock;Blue;Bed Memory Recall: Sock;Blue;Bed Memory Recall Bed: With Cue Attention: Alternating;Selective Selective Attention: Appears intact Awareness: Impaired  Problem Solving: Impaired Safety/Judgment: Impaired Sensation Sensation Light Touch: Appears Intact Stereognosis: Impaired Detail Stereognosis Impaired Details: Impaired LUE(correctly identified 3/5 items) Coordination Gross Motor Movements are Fluid and Coordinated: No Fine Motor Movements are Fluid and Coordinated: No Coordination and Movement Description: Ataxia in the LUE>LLE Finger Nose Finger Test: dysmetria in LUE Heel Shin Test: Ataxia in the LLE 9 Hole Peg Test: Rt: 25 seconds and Lt: 45 seconds Extremity/Trunk Assessment RUE Assessment RUE Assessment: Within Functional Limits LUE Assessment LUE Assessment: Within Functional Limits General Strength Comments: strength grossly 4/5   See Function Navigator for Current Functional Status.   Refer to Care Plan for Long Term Goals  Recommendations for other services: Therapeutic Recreation  Stress management and Outing/community reintegration   Discharge Criteria: Patient will be discharged from OT  if patient refuses treatment 3 consecutive times without medical reason, if treatment goals not met, if there is a change in medical status, if patient makes no progress towards goals or if patient is discharged from hospital.  The above assessment, treatment plan, treatment alternatives and goals were discussed and mutually agreed upon: by patient  Simonne Come 12/05/2017, 2:01 PM

## 2017-12-06 ENCOUNTER — Inpatient Hospital Stay (HOSPITAL_COMMUNITY): Payer: Medicare Other

## 2017-12-06 ENCOUNTER — Inpatient Hospital Stay (HOSPITAL_COMMUNITY): Payer: Medicare Other | Admitting: Physical Therapy

## 2017-12-06 ENCOUNTER — Inpatient Hospital Stay (HOSPITAL_COMMUNITY): Payer: Medicare Other | Admitting: Occupational Therapy

## 2017-12-06 LAB — GLUCOSE, CAPILLARY
GLUCOSE-CAPILLARY: 124 mg/dL — AB (ref 65–99)
Glucose-Capillary: 110 mg/dL — ABNORMAL HIGH (ref 65–99)

## 2017-12-06 NOTE — Progress Notes (Addendum)
Physical Therapy Session Note  Patient Details  Name: Miguel Alvarez MRN: [id number] Date of Birth: December 29, 1950  Today's Date: 12/06/2017 PT Individual Time: 1400-1500 AND 1620-1700 PT Individual Time Calculation (min): 60 min AND 45  Short Term Goals: Week 1:  PT Short Term Goal 1 (Week 1): Pt will ambulate 15f with min assist and LRAD PT Short Term Goal 2 (Week 1): Pt will perform bed<>WC transfers with supervision assist  PT Short Term Goal 3 (Week 1): Pt will maintain standing balance without UE support and min assist to prepare for pre-gait activities.  PT Short Term Goal 4 (Week 1): Pt will improve berg balance test score by 7 point to indicate reduced fall risk with functional mobility.   Skilled Therapeutic Interventions/Progress Updates:   Pt received sitting in WC and agreeable to PT  WC mobility training through rehab unit x 2050fwith distant supervision assist from Pt with intermittent cues for turning technique and obstacle navigation.   Nustep reciprocal movement training x 8 mintues with min cues from PT for proper speed, ROM and force of pedaling throughout the LLE to improve symmetry of movement and increase Neuromotor control.  Transfers instructed by PT includung sit<>stand and stand pivot with supervision assist and UE support on RW. Min cues for gait pattern and proper UE placement to improve safety. .   Gait training with RW 2x 20031fith intermittent supervision assist-min assist as well as min cues for increased step width and for safety awareness in turns,   VOR 1 and VOR 2 4 x 1 min each direction with cues for proper speed to remain sx free. Saccades R and L 4 x 1 min and 4 x 1 min vertically with verbal cues for proper speed and head stabilization.  PT instructed pt in core stabilization therex to engage TA through diaphragmatic breathing. 3 x 2 min in sitting with poor carryover to minimize accessory trunk muscles. Pt positioned in Supine with additional  bouts of diaphragmatic breathing with tactile feedback to improved use of of TA 4 x 1 min. Improved activation of TA noted in supine. Pt also instructed in lumbar rotation R and L 2 x 5 bil. And knee to chest stretch 2 x 30 sec hold Bil.    Patient returned to room and [facility] and all needs met.      Session 2.   Pt received sitting in WC and agreeable to PT. Gait training with and without AD in rehab gym. Gait with 4lb ankle weight on the LLE to increase proprioceptive feedback and improve coordination withand without RW. Pt also instructed in gait training over unlevel surface of cement side walk at hospital entrance 2 x 150f65fth RW and min assist and moderate cues for AD management and decreased cadence to improve safety.   [facility] returned to room and [facility] and all needs met.         Therapy Documentation Precautions:  Precautions Precautions: Fall Restrictions Weight Bearing Restrictions: No Vital Signs: Therapy Vitals Temp: 98.5 F (36.9 C) Temp Source: Oral Pulse Rate: 80 Resp: 18 BP: (!) 155/84 Patient Position (if appropriate): Sitting Oxygen Therapy SpO2: 98 % O2 Device: Room Air Pain:   denies  See Function Navigator for Current Functional Status.   Therapy/Group: Individual Therapy  AustLorie Phenix0/2019, 5:26 PM

## 2017-12-06 NOTE — Progress Notes (Signed)
Social Work   Dmitri Pettigrew, Elveria Risingebecca G, LCSW  Social Worker  Physical Medicine and Rehabilitation  Patient Care Conference  Signed  Date of Service:  12/05/2017  1:53 PM          Signed          Show:Clear all [x] Manual[x] Template[] Copied  Added by: [x] Maykayla Highley, Lemar Livingsebecca G, LCSW   [] Hover for details   Inpatient RehabilitationTeam Conference and Plan of Care Update Date: 12/05/2017   Time: 11:55 AM      Patient Name: Brita Romphomas Scioneaux      Medical Record Number: [id number]  Date of Birth: 10/07/1950 Sex: Male         Room/Bed: 4M07C/4M07C-01 Payor Info: Payor: MEDICARE / Plan: MEDICARE PART A AND B / Product Type: *No Product type* /     Admitting Diagnosis: L CVA  Admit Date/Time:  12/04/2017  3:33 PM Admission Comments: No comment available    Primary Diagnosis:  <principal problem not specified> Principal Problem: <principal problem not specified>       Patient Active Problem List    Diagnosis Date Noted  . Cerebellar infarct ([facility]) 12/05/2017  . Dizziness 12/02/2017  . Hyperlipidemia 12/02/2017  . Acute CVA (cerebrovascular accident) ([facility]) 12/02/2017  . Atrial fibrillation, chronic ([facility]) 12/02/2017  . Mucosal abnormality of esophagus    . Diverticulosis of colon without hemorrhage    . Blood in stool    . Barrett's esophagus 09/03/2015  . GERD (gastroesophageal reflux disease) 09/03/2015  . Normocytic anemia 09/03/2015  . Heme positive stool 09/03/2015      Expected Discharge Date: Expected Discharge Date: 12/14/17   Team Members Present: Physician leading conference: Dr. Claudette LawsAndrew Kirsteins Social Worker Present: Dossie DerBecky Layanna Charo, LCSW Nurse Present: Kennon PortelaJeanna Hicks, RN PT Present: Grier RocherAustin Tucker, PT;Rosita Dechalus, PTA OT Present: Rosalio LoudSarah Hoxie, OT PPS Coordinator present : Tora DuckMarie Noel, RN, CRRN       Current Status/Progress Goal Weekly Team Focus  Medical     cerebellar infarct, afib, low back pain, htn  improve balance  bp control, mgt of low back pain, stroke  sequelae   Bowel/Bladder       Cont B & B        Swallow/Nutrition/ Hydration               ADL's     min assist bathroom transfers with RW, LUE weakness and dysmetria, pt reports wife assisted with LB dressing  Supervision overall  ADL retraining, safety awareness, LUE NMR   Mobility       eval pending        Communication               Safety/Cognition/ Behavioral Observations             Pain       headache treating with tylenol and MD to prescribe stronger med        Skin       no skin issues          *See Care Plan and progress notes for long and short-term goals.      Barriers to Discharge   Current Status/Progress Possible Resolutions Date Resolved   Physician     Medical stability              Nursing                 PT  Inaccessible home environment;Medical stability;Weight  OT                 SLP            SW              Discharge Planning/Teaching Needs:    Home with wife who can provide supervision level. Pt is doing well and wants to go home asap.     Team Discussion:  Goals . Being evaluated today. Cont B & B. Currently mod assist level. Double vision better today. HOH and dizziness working on also.  Revisions to Treatment Plan: New eval 10-14 days ELOS    Continued Need for Acute Rehabilitation Level of Care: The patient requires daily medical management by a physician with specialized training in physical medicine and rehabilitation for the following conditions: Daily direction of a multidisciplinary physical rehabilitation program to ensure safe treatment while eliciting the highest outcome that is of practical value to the patient.: Yes Daily medical management of patient stability for increased activity during participation in an intensive rehabilitation regime.: Yes Daily analysis of laboratory values and/or radiology reports with any subsequent need for medication adjustment of medical intervention for : Neurological  problems   Lucy Chris 12/06/2017, 9:44 AM                 Patient ID: Brita Romp, male   DOB: 1951/02/11, 67 y.o.   MRN: [id number]

## 2017-12-06 NOTE — Progress Notes (Addendum)
Miguel Alvarez PHYSICAL MEDICINE & REHABILITATION     PROGRESS NOTE    Subjective/Complaints: Back pain reasonable. Hasn't used heat or muscle relaxant. Feels that double vision improving  ROS: Patient denies fever, rash, sore throat, blurred vision, nausea, vomiting, diarrhea, cough, shortness of breath or chest pain, joint or back pain, headache, or mood change.    Objective:  No results found. Recent Labs    12/05/17 0709  WBC 10.2  HGB 13.4  HCT 41.9  PLT 271   Recent Labs    12/05/17 0709  NA 142  K 4.3  CL 109  GLUCOSE 128*  BUN 15  CREATININE 1.07  CALCIUM 9.5   CBG (last 3)  Recent Labs    12/06/17 0019 12/06/17 0504  GLUCAP 110* 124*    Wt Readings from Last 3 Encounters:  12/04/17 129 kg (284 lb 6.3 oz)  12/02/17 131.9 kg (290 lb 12.6 oz)  10/28/15 (!) 136.5 kg (301 lb)     Intake/Output Summary (Last 24 hours) at 12/06/2017 1100 Last data filed at 12/06/2017 0850 Gross per 24 hour  Intake 720 ml  Output -  Net 720 ml    Vital Signs: Blood pressure (!) 150/78, pulse 81, temperature 98.3 F (36.8 C), temperature source Oral, resp. rate 18, height 6\' 2"  (1.88 m), weight 129 kg (284 lb 6.3 oz), SpO2 94 %. Physical Exam:  Constitutional: No distress . Vital signs reviewed. HEENT: EOMI, oral membranes moist Neck: supple Cardiovascular:  IRR without murmur. No JVD    Respiratory: CTA Bilaterally without wheezes or rales. Normal effort    GI: BS +, non-tender, non-distended   Neurological: He isalert. Patient is alert. Speech is fluent. No dysarthria. Follows simple commands. Mild left-sided facial sensory loss, upper quadrant. Mild dysmetria LUE and LLE but improving.  Skin: Skin iswarm.  Psychiatric: Cooperative, pleasant    Assessment/Plan: 1. 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 Physiatrist is providing close  team supervision and 24 hour management of active medical problems listed below. Physiatrist and rehab team continue to assess barriers to discharge/monitor patient progress toward functional and medical goals.  Function:  Bathing Bathing position   Position: Shower  Bathing parts Body parts bathed by patient: Right arm, Left arm, Chest, Abdomen, Front perineal area, Buttocks, Right upper leg, Left upper leg, Right lower leg, Left lower leg, Back    Bathing assist Assist Level: Touching or steadying assistance(Pt > 75%)      Upper Body Dressing/Undressing Upper body dressing   What is the patient wearing?: Pull over shirt/dress     Pull over shirt/dress - Perfomed by patient: Thread/unthread right sleeve, Thread/unthread left sleeve, Put head through opening, Pull shirt over trunk          Upper body assist Assist Level: Touching or steadying assistance(Pt > 75%)      Lower Body Dressing/Undressing Lower body dressing   What is the patient wearing?: Underwear, Pants, Socks, Shoes Underwear - Performed by patient: Thread/unthread right underwear leg, Thread/unthread left underwear leg, Pull underwear up/down   Pants- Performed by patient: Thread/unthread right pants leg, Thread/unthread left pants leg, Pull pants up/down       Socks - Performed by patient: Don/doff right sock, Don/doff left sock   Shoes - Performed by patient: Don/doff right shoe, Don/doff left shoe, Fasten right, Fasten left            Lower  body assist Assist for lower body dressing: Touching or steadying assistance (Pt > 75%)      Toileting Toileting          Toileting assist     Transfers Chair/bed transfer   Chair/bed transfer method: Stand pivot Chair/bed transfer assist level: Touching or steadying assistance (Pt > 75%) Chair/bed transfer assistive device: Armrests     Locomotion Ambulation     Max distance: 156ft Assist level: Moderate assist (Pt 50 - 74%)   Wheelchair   Type:  Manual Max wheelchair distance: 144ft  Assist Level: Supervision or verbal cues  Cognition Comprehension Comprehension assist level: Follows basic conversation/direction with no assist  Expression Expression assist level: Expresses complex ideas: With extra time/assistive device  Social Interaction Social Interaction assist level: Interacts appropriately with others - No medications needed.  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 DVT Prophylaxis/Anticoagulation: Eliquis. Monitor for any bleeding episodes 3. Pain Management:Tylenol as needed  -heat/ice for low back. Observe for now  -continue muscle relaxant prn 4. Mood:Provide emotional support 5. Neuropsych: This patientiscapable of making decisions on hisown behalf. 6. Skin/Wound Care:Routine skin checks 7. Fluids/Electrolytes/Nutrition:good po  -encourage PO intake.   8.Hypertension. Norvasc 5 mg daily. May increase given borderline/high BP's 9.Atrial fibrillation. Cardiac rate controlled at present. Continue Eliquis 10.Hyperlipidemia. Lipitor 11.GERD. Protonix     LOS (Days) 2 A FACE TO FACE EVALUATION WAS PERFORMED  Ranelle Oyster, MD 12/06/2017 11:00 AM

## 2017-12-06 NOTE — Progress Notes (Signed)
Occupational Therapy Session Note  Patient Details  Name: Miguel Alvarez MRN: [id number] Date of Birth: 05/05/1951  Today's Date: 12/06/2017 OT Individual Time: [id number] OT Individual Time Calculation (min): 60 min    Short Term Goals: Week 1:  OT Short Term Goal 1 (Week 1): Pt will complete LB dressing with min assist OT Short Term Goal 2 (Week 1): Pt will complete bathing at sit > stand level in shower with min assist OT Short Term Goal 3 (Week 1): Pt will complete toilet transfers with LRAD with supervision OT Short Term Goal 4 (Week 1): Pt will complete 2 grooming tasks in standing for increased activity tolerance  Skilled Therapeutic Interventions/Progress Updates:    Treatment session with focus on ADL retraining, dynamic standing balance, and visual habituation activities.  Pt received upright in w/c with wife present.  Pt ambulated to room shower with RW with min guard.  Pt completed bathing at sit > stand level when washing buttocks with use of grab bar for steady assist when standing and min guard when standing.  Completed dressing at sit > stand level with min/steady assist when standing to pull underwear and pants over hips.  Pt utilized RW to steady self during standing as well.  Pt donned socks and shoes without assist from a seated level.  Pt reports increase in dizziness when looking to floor when completing LB dressing, especially tying shoes.  Provided pt with "A" cards and educated on horizontal and vertical head turns for habituation to decrease onset of dizziness with head turns.  Pt able to return demonstration of exercises.  Wife present and educated her as well on exercises and encouraged 2-3x/ day for habituation.    Therapy Documentation Precautions:  Precautions Precautions: Fall Restrictions Weight Bearing Restrictions: No Pain: Pain Assessment Pain Scale: 0-10 Pain Score: 0-No pain  See Function Navigator for Current Functional Status.   Therapy/Group:  Individual Therapy  Rosalio LoudHOXIE, Shawn Carattini 12/06/2017, 10:52 AM

## 2017-12-06 NOTE — Progress Notes (Signed)
Physical Therapy Session Note  Patient Details  Name: Miguel Alvarez MRN: [id number] Date of Birth: 12/22/1950  Today's Date: 12/06/2017 PT Individual Time: 1000-1030 PT Individual Time Calculation (min): 30 min   Short Term Goals: Week 1:  PT Short Term Goal 1 (Week 1): Pt will ambulate 12000ft with min assist and LRAD PT Short Term Goal 2 (Week 1): Pt will perform bed<>WC transfers with supervision assist  PT Short Term Goal 3 (Week 1): Pt will maintain standing balance without UE support and min assist to prepare for pre-gait activities.  PT Short Term Goal 4 (Week 1): Pt will improve berg balance test score by 7 point to indicate reduced fall risk with functional mobility.   Skilled Therapeutic Interventions/Progress Updates:   Functional gait to/from therapy gym with RW with steadying assist for balance especially during turns with cues for maintaining focus on fixed object down hallway to help with dizziness pt reports during mobility (education initially provided on use of object to focus on during transitional movements). NMR to address balance and postural control re-training without UE support for blocked practice sit <> stands without UE support (initially min assist progressing to close supervision) with facilitation for weightshift, alternating toe taps (min to light mod assist) to 4" block without UE support with focus on controlled movement and regaining balance during weightshifts, heel and toe raises without UE support (min to light mod assist to prevent LOB anteriorly) x 20 reps each direction for balance re-training.   Therapy Documentation Precautions:  Precautions Precautions: Fall Restrictions Weight Bearing Restrictions: No  Pain: Pain Assessment Pain Scale: 0-10 Pain Score: 0-No pain   See Function Navigator for Current Functional Status.   Therapy/Group: Individual Therapy  Karolee StampsGray, Ruther Ephraim Darrol PokeBrescia  Audley Hinojos B. Breonia Kirstein, PT, DPT  12/06/2017, 11:21 AM

## 2017-12-07 ENCOUNTER — Inpatient Hospital Stay (HOSPITAL_COMMUNITY): Payer: Medicare Other | Admitting: Occupational Therapy

## 2017-12-07 ENCOUNTER — Inpatient Hospital Stay (HOSPITAL_COMMUNITY): Payer: Medicare Other | Admitting: Physical Therapy

## 2017-12-07 NOTE — IPOC Note (Signed)
Overall Plan of Care [facility](IPOC) Patient Details Name: Miguel Alvarez MRN: [id number] DOB: 03/10/1951  Admitting Diagnosis: <principal problem not specified>cerebellar infarct  Hospital Problems: Active Problems:   Atrial fibrillation, chronic (HCC)   Cerebellar infarct (HCC)     Functional Problem List: Nursing Endurance, Motor, Safety, Sensory  PT Balance, Behavior, Endurance, Motor, Safety, Sensory, Pain  OT Balance, Endurance, Cognition, Motor, Pain, Sensory, Vision, Safety  SLP    TR         Basic ADL's: OT Grooming, Bathing, Dressing, Toileting     Advanced  ADL's: OT Simple Meal Preparation     Transfers: PT Bed Mobility, Bed to Chair, Car, Furniture, Floor, Other (comment)  OT Toilet, Tub/Shower     Locomotion: PT Ambulation, Psychologist, prison and probation servicesWheelchair Mobility, Stairs     Additional Impairments: OT Fuctional Use of Upper Extremity  SLP        TR      Anticipated Outcomes Item Anticipated Outcome  Self Feeding Mod I  Swallowing      Basic self-care  Supervision  Toileting  Supervision-Mod I   Bathroom Transfers Supervision  Bowel/Bladder  Patient will continue to be continent of bowel and bladder during admission  Transfers  Supervision assist with LRAD   Locomotion  Ambulatory at household level with LRAD   Communication     Cognition     Pain  Patient will be pain free or pain level less than 3  Safety/Judgment  Patient will be free from falls and adhere to safety plan   Therapy Plan: PT Intensity: Minimum of 1-2 x/day ,45 to 90 minutes PT Frequency: 5 out of 7 days PT Duration Estimated Length of Stay: 10-14 days  OT Intensity: Minimum of 1-2 x/day, 45 to 90 minutes OT Frequency: 5 out of 7 days OT Duration/Estimated Length of Stay: 10-12 days      Team Interventions: Nursing Interventions Patient/Family Education, Disease Management/Prevention, Medication Management  PT interventions Ambulation/gait training, Warden/rangerBalance/vestibular training, Cognitive  remediation/compensation, Community reintegration, Discharge planning, DME/adaptive equipment instruction, Neuromuscular re-education, Pain management, Patient/family education, Functional mobility training, Skin care/wound management, Psychosocial support, Stair training, Splinting/orthotics, Therapeutic Exercise, Therapeutic Activities, UE/LE Strength taining/ROM, Visual/perceptual remediation/compensation, Wheelchair propulsion/positioning, UE/LE Coordination activities  OT Interventions Warden/rangerBalance/vestibular training, Cognitive remediation/compensation, Community reintegration, Discharge planning, Disease mangement/prevention, DME/adaptive equipment instruction, Functional mobility training, Neuromuscular re-education, Pain management, Patient/family education, Psychosocial support, Self Care/advanced ADL retraining, Therapeutic Activities, Therapeutic Exercise, UE/LE Strength taining/ROM, UE/LE Coordination activities, Visual/perceptual remediation/compensation  SLP Interventions    TR Interventions    SW/CM Interventions Discharge Planning, Psychosocial Support, Patient/Family Education   Barriers to Discharge MD  Medical stability  Nursing      PT Inaccessible home environment, Medical stability, Weight    OT      SLP      SW       Team Discharge Planning: Destination: PT-Home ,OT- Home , SLP-  Projected Follow-up: PT-Home health PT, OT-  Outpatient OT, SLP-  Projected Equipment Needs: PT-Rolling walker with 5" wheels, Wheelchair (measurements), Wheelchair cushion (measurements), OT- 3 in 1 bedside comode, Tub/shower bench, SLP-  Equipment Details: PT- , OT-  Patient/family involved in discharge planning: PT- Patient, Family member/caregiver,  OT-Patient, SLP-   MD ELOS: 10-12 days Medical Rehab Prognosis:  Excellent Assessment: The patient has been admitted for CIR therapies with the diagnosis of cerebellar infarct. The team will be addressing functional mobility, strength, stamina,  balance, safety, adaptive techniques and equipment, self-care, bowel and bladder mgt, patient and caregiver education, NMR, vestibular assessment  and treatment, pain control, ego support, community reentry. Goals have been set at mod I to supervision for mobility and self-care.    Ranelle Oyster, MD, FAAPMR      See Team Conference Notes for weekly updates to the plan of care

## 2017-12-07 NOTE — Progress Notes (Signed)
Social Work Patient ID: Miguel Alvarez, male   DOB: 1951/02/18, 67 y.o.   MRN: [id number]  Met with pt today to review d/c needs and date.  Per therapists, they feel that pt's d/c to be moved earlier to 12/12/17.  PA aware and agreeable.  Pt also "very OK with that!".  Will follow up with team about DME and f/u needs.  Faye Strohman, LCSW

## 2017-12-07 NOTE — Progress Notes (Signed)
[facility] [place] PHYSICAL MEDICINE & REHABILITATION     PROGRESS NOTE    Subjective/Complaints: Making progress. Vision improving. Doing exercises on his own  ROS: Patient denies fever, rash, sore throat, blurred vision, nausea, vomiting, diarrhea, cough, shortness of breath or chest pain, joint or back pain, headache, or mood change.   Objective:  No results found. Recent Labs    12/05/17 0709  WBC 10.2  HGB 13.4  HCT 41.9  PLT 271   Recent Labs    12/05/17 0709  NA 142  K 4.3  CL 109  GLUCOSE 128*  BUN 15  CREATININE 1.07  CALCIUM 9.5   CBG (last 3)  Recent Labs    12/06/17 0019 12/06/17 0504  GLUCAP 110* 124*    Wt Readings from Last 3 Encounters:  12/04/17 129 kg (284 lb 6.3 oz)  12/02/17 131.9 kg (290 lb 12.6 oz)  10/28/15 (!) 136.5 kg (301 lb)     Intake/Output Summary (Last 24 hours) at 12/07/2017 1044 Last data filed at 12/07/2017 0746 Gross per 24 hour  Intake 700 ml  Output -  Net 700 ml    Vital Signs: Blood pressure (!) 142/68, pulse 74, temperature 98.2 F (36.8 C), temperature source Oral, resp. rate 18, height 6\' 2"  (1.88 m), weight 129 kg (284 lb 6.3 oz), SpO2 92 %. Physical Exam:  Constitutional: No distress . Vital signs reviewed. HEENT: EOMI, oral membranes moist Neck: supple Cardiovascular: RRR without murmur. No JVD    Respiratory: CTA Bilaterally without wheezes or rales. Normal effort    GI: BS +, non-tender, non-distended   Neurological: He isalert. Patient is alert. Speech is fluent. No dysarthria. Follows simple commands. Mild left-sided facial sensory loss, upper quadrant. Continued mild dysmetria LUE and LLE but improving. visual tracking improved Skin: Skin iswarm.  Psychiatric: Cooperative, pleasant    Assessment/Plan: 1. 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 Physiatrist is providing close  team supervision and 24 hour management of active medical problems listed below. Physiatrist and rehab team continue to assess barriers to discharge/monitor patient progress toward functional and medical goals.  Function:  Bathing Bathing position   Position: Shower  Bathing parts Body parts bathed by patient: Right arm, Left arm, Chest, Abdomen, Front perineal area, Buttocks, Right upper leg, Left upper leg, Right lower leg, Left lower leg, Back    Bathing assist Assist Level: Set up   Set up : To obtain items  Upper Body Dressing/Undressing Upper body dressing   What is the patient wearing?: Pull over shirt/dress     Pull over shirt/dress - Perfomed by patient: Thread/unthread right sleeve, Thread/unthread left sleeve, Put head through opening, Pull shirt over trunk          Upper body assist Assist Level: Set up   Set up : To obtain clothing/put away  Lower Body Dressing/Undressing Lower body dressing   What is the patient wearing?: Underwear, Pants, Socks, Shoes Underwear - Performed by patient: Thread/unthread right underwear leg, Thread/unthread left underwear leg, Pull underwear up/down   Pants- Performed by patient: Thread/unthread right pants leg, Thread/unthread left pants leg, Pull pants up/down       Socks - Performed by patient: Don/doff right sock, Don/doff left sock   Shoes - Performed by patient: Don/doff right shoe, Don/doff left shoe, Fasten right, Fasten left            Lower body assist Assist  for lower body dressing: Set up   Set up : To obtain clothing/put away  Toileting Toileting   Toileting steps completed by patient: Adjust clothing prior to toileting, Performs perineal hygiene, Adjust clothing after toileting      Toileting assist Assist level: Supervision or verbal cues   Transfers Chair/bed transfer   Chair/bed transfer method: Stand pivot Chair/bed transfer assist level: Supervision or verbal cues Chair/bed transfer assistive  device: Patent attorneyWalker     Locomotion Ambulation     Max distance: 16950ft Assist level: Touching or steadying assistance (Pt > 75%)   Wheelchair   Type: Manual Max wheelchair distance: 24700ft Assist Level: Supervision or verbal cues  Cognition Comprehension Comprehension assist level: Follows basic conversation/direction with no assist  Expression Expression assist level: Expresses basic needs/ideas: With no assist  Social Interaction Social Interaction assist level: Interacts appropriately with others - No medications needed.  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 DVT Prophylaxis/Anticoagulation: Eliquis. Monitor for any bleeding episodes 3. Pain Management:Tylenol as needed  -heat/ice for low back. Observe for now  -continue muscle relaxant prn 4. Mood:Provide emotional support 5. Neuropsych: This patientiscapable of making decisions on hisown behalf. 6. Skin/Wound Care:Routine skin checks 7. Fluids/Electrolytes/Nutrition:good po  -encourage PO intake.   8.Hypertension. Norvasc 5 mg daily. Improved control, hold on any changes at present 9.Atrial fibrillation. Cardiac rate controlled at present. Continue Eliquis 10.Hyperlipidemia. Lipitor 11.GERD. Protonix     LOS (Days) 3 A FACE TO FACE EVALUATION WAS PERFORMED  Ranelle OysterZachary T Treyce Spillers, MD 12/07/2017 10:44 AM

## 2017-12-07 NOTE — Progress Notes (Signed)
Physical Therapy Session Note  Patient Details  Name: Miguel Alvarez MRN: [id number] Date of Birth: 10-26-1950  Today's Date: 12/07/2017 PT Individual Time:1500-1600   60 min   Short Term Goals: Week 1:  PT Short Term Goal 1 (Week 1): Pt will ambulate 185f with min assist and LRAD PT Short Term Goal 2 (Week 1): Pt will perform bed<>WC transfers with supervision assist  PT Short Term Goal 3 (Week 1): Pt will maintain standing balance without UE support and min assist to prepare for pre-gait activities.  PT Short Term Goal 4 (Week 1): Pt will improve berg balance test score by 7 point to indicate reduced fall risk with functional mobility.   Skilled Therapeutic Interventions/Progress Updates:   Pt received sitting in WC and agreeable to PT  Gait training with RW through hall of rehab unit 4 x 158fwith intermittent supervision -min assist from PT with min cues for safety in turns, as well as AD management, increased BOS, and gaze stabilization to reduce dizziness. Gait training without AD instructed by PT with min assist overall and occasional mod assist x [id number] over unlevel surface of 10 ft  with moderate cues from PT for gaze stabilization in turns, increased BOS, and use of ankle strategy to prevent L LOB and improve weight shift to the RLE.  Pt reports mild increase in dizziness without AD, but stable dizziness 3/10 with RW.   Dynamic balance training : Reciprocal foot taps on 1 of 2 targets standing on unlevel mat surface x 10. Lateral foot taps standing on unlevel surface x 10 BLE. No UE support and min assist from PT overall. Pt reports no increase in dizziness with stepping task. Reciprocal foot taps on 4 inch step 3x10 BLE with UE support progressing to no UE support min assist from PT throughout with increasing facilitation for weight shift with increased difficulty of no UE support.   Patient returned to room and [facility]  and all needs met.              Therapy Documentation Precautions:  Precautions Precautions: Fall Restrictions Weight Bearing Restrictions: No Vital Signs: Therapy Vitals Temp: 98.6 F (37 C) Temp Source: Oral Pulse Rate: 77 Resp: 18 BP: 116/75 Patient Position (if appropriate): Sitting Oxygen Therapy SpO2: 97 % O2 Device: Room Air Pain: denies  See Function Navigator for Current Functional Status.   Therapy/Group: Individual Therapy  AustiLorie Phenix/2019, 3:12 PM

## 2017-12-07 NOTE — Progress Notes (Signed)
Occupational Therapy Session Note  Patient Details  Name: Miguel Alvarez MRN: [id number] Date of Birth: 05/06/1951  Today's Date: 12/07/2017 OT Individual Time: 1100-1130 OT Individual Time Calculation (min): 30 min    Short Term Goals: Week 1:  OT Short Term Goal 1 (Week 1): Pt will complete LB dressing with min assist OT Short Term Goal 2 (Week 1): Pt will complete bathing at sit > stand level in shower with min assist OT Short Term Goal 3 (Week 1): Pt will complete toilet transfers with LRAD with supervision OT Short Term Goal 4 (Week 1): Pt will complete 2 grooming tasks in standing for increased activity tolerance  Skilled Therapeutic Interventions/Progress Updates:    1:1 Focus on dynamic standing balance and head/eye coordination with functional activity. Stood on foam (compliant surface) statically with mod to max A for balance; demonstrated strong forward and left bias with decr awareness. Then transitioned to sit to stands on foam with min to mod A. Pt also preformed eye/head coordination tasks with stable and moving targets. Pt transitioned to walking with walker while performing head movements (up/down and laterally). Pt reported dizziness no greater than a 4 with occasional LOB but able to correct with min A and with RW.  Transitioned to walking without AD in the hallway down to the ADL apartment. Transitioned around the room (from couch to recliner to in and out of bed.) with min A. Left up in the recliner in his room,   Therapy Documentation Precautions:  Precautions Precautions: Fall Restrictions Weight Bearing Restrictions: No Pain:  no c/o pain in session  See Function Navigator for Current Functional Status.   Therapy/Group: Individual Therapy  Roney MansSmith, Charnice Zwilling [facility] 12/07/2017, 11:39 AM

## 2017-12-07 NOTE — Progress Notes (Signed)
Occupational Therapy Session Note  Patient Details  Name: Miguel Alvarez MRN: [id number] Date of Birth: 08/31/1950  Today's Date: 12/07/2017 OT Individual Time: [id number] and 1345-1430 OT Individual Time Calculation (min): 60 min and 45 min   Short Term Goals: Week 1:  OT Short Term Goal 1 (Week 1): Pt will complete LB dressing with min assist OT Short Term Goal 2 (Week 1): Pt will complete bathing at sit > stand level in shower with min assist OT Short Term Goal 3 (Week 1): Pt will complete toilet transfers with LRAD with supervision OT Short Term Goal 4 (Week 1): Pt will complete 2 grooming tasks in standing for increased activity tolerance  Skilled Therapeutic Interventions/Progress Updates:    1) Treatment session with focus on ADL retraining, dynamic standing balance, vision, and LUE NMR.  Pt received supine in bed reporting need to toilet.  Ambulated to toilet with RW with supervision.  Pt completed all toileting tasks with distant supervision with UE support on grab bar or RW.  Transferred from toilet to bench in room shower with RW and supervision.  Engaged in bathing at sit > stand level in room shower with distant supervision after setup of items.  Pt completed dressing at sit > stand level from tub bench with setup for items.  Ambulated into room to don socks and shoes.  Grooming tasks completed in standing at sink with pt completing oral care and brushing hair with supervision for standing balance.  Pt ambulated to therapy gym with RW with supervision. Engaged in dynamic standing balance incorporating trunk rotation, crossing midline, and reaching outside BOS to focus on motor control and reaching with LUE.  Increased challenge by placing 2" step under RLE to promote weight shift through LLE while engaging in reaching activity.  Engaged in seated ball toss Rt <> Lt hand with focus on motor coordination with LUE as well as visual scanning with quick eye movements side to side.  Also  completed visual scanning up and down with single hand ball toss vertically in Lt hand.  Pt reports dizziness and blurriness improving, with exception of quick movements.  Ambulated back to room with RW alternating head turns to read signs on wall, completed at supervision level.  2) Treatment session with focus on LUE NMR and dynamic standing balance.  Pt ambulated to therapy gym with RW with supervision.  Engaged in standing activity at table with focus on LUE gross and fine motor control with peg activity progressing to in-hand manipulation and translation of pegs.  [facility] with use of coins with stacking, in-hand manipulation, and translation as well as opening and closing pill bottles while seated.  Engaged in stacking cups and picking up small animal figurines in standing with min guard to supervision.  Ambulated back to room and left upright in w/c with all needs in reach.  Therapy Documentation Precautions:  Precautions Precautions: Fall Restrictions Weight Bearing Restrictions: No General:   Vital Signs: Therapy Vitals BP: (!) 142/68 Pain:  Pt reports pain in Lt lower back into hip.  Requested muscle relaxer from RN.  Monitored during session.  See Function Navigator for Current Functional Status.   Therapy/Group: Individual Therapy  Rosalio LoudHOXIE, Kleber Crean 12/07/2017, 10:42 AM

## 2017-12-08 ENCOUNTER — Inpatient Hospital Stay (HOSPITAL_COMMUNITY): Payer: Medicare Other | Admitting: Occupational Therapy

## 2017-12-08 DIAGNOSIS — I69393 Ataxia following cerebral infarction: Secondary | ICD-10-CM

## 2017-12-08 NOTE — Progress Notes (Signed)
[place] PHYSICAL MEDICINE & REHABILITATION     PROGRESS NOTE    Subjective/Complaints:   ROS: Patient denies fever, rash, sore throat, blurred vision, nausea, vomiting, diarrhea, cough, shortness of breath or chest pain, joint or back pain, headache, or mood change.   Objective:  No results found. No results for input(s): WBC, HGB, HCT, PLT in the last 72 hours. No results for input(s): NA, K, CL, GLUCOSE, BUN, CREATININE, CALCIUM in the last 72 hours.  Invalid input(s): CO CBG (last 3)  Recent Labs    12/06/17 0019 12/06/17 0504  GLUCAP 110* 124*    Wt Readings from Last 3 Encounters:  12/04/17 129 kg (284 lb 6.3 oz)  12/02/17 131.9 kg (290 lb 12.6 oz)  10/28/15 (!) 136.5 kg (301 lb)     Intake/Output Summary (Last 24 hours) at 12/08/2017 0807 Last data filed at 12/07/2017 1826 Gross per 24 hour  Intake 480 ml  Output -  Net 480 ml    Vital Signs: Blood pressure 137/74, pulse 78, temperature 97.9 F (36.6 C), temperature source Oral, resp. rate 18, height 6\' 2"  (1.88 m), weight 129 kg (284 lb 6.3 oz), SpO2 97 %. Physical Exam:  Constitutional: No distress . Vital signs reviewed. HEENT: EOMI, oral membranes moist Neck: supple Cardiovascular: RRR without murmur. No JVD    Respiratory: CTA Bilaterally without wheezes or rales. Normal effort    GI: BS +, non-tender, non-distended   Neurological: He isalert. Patient is alert. Speech is fluent. No dysarthria. Follows simple commands. Mild left-sided facial sensory loss, upper quadrant. Continued mild dysmetria LUE and LLE but improving. visual tracking improved Skin: Skin iswarm.  Psychiatric: Cooperative, pleasant Mild dysmetria Left finger nose finger   Assessment/Plan: 1. 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 Physiatrist is providing close team supervision and 24 hour management  of active medical problems listed below. Physiatrist and rehab team continue to assess barriers to discharge/monitor patient progress toward functional and medical goals.  Function:  Bathing Bathing position   Position: Shower  Bathing parts Body parts bathed by patient: Right arm, Left arm, Chest, Abdomen, Front perineal area, Buttocks, Right upper leg, Left upper leg, Right lower leg, Left lower leg, Back    Bathing assist Assist Level: Set up   Set up : To obtain items  Upper Body Dressing/Undressing Upper body dressing   What is the patient wearing?: Pull over shirt/dress     Pull over shirt/dress - Perfomed by patient: Thread/unthread right sleeve, Thread/unthread left sleeve, Put head through opening, Pull shirt over trunk          Upper body assist Assist Level: Set up   Set up : To obtain clothing/put away  Lower Body Dressing/Undressing Lower body dressing   What is the patient wearing?: Underwear, Pants, Socks, Shoes Underwear - Performed by patient: Thread/unthread right underwear leg, Thread/unthread left underwear leg, Pull underwear up/down   Pants- Performed by patient: Thread/unthread right pants leg, Thread/unthread left pants leg, Pull pants up/down       Socks - Performed by patient: Don/doff right sock, Don/doff left sock   Shoes - Performed by patient: Don/doff right shoe, Don/doff left shoe, Fasten right, Fasten left            Lower body assist Assist for lower body dressing: Set up   Set up : To obtain clothing/put away  Toileting Toileting   Toileting steps  completed by patient: Adjust clothing prior to toileting, Performs perineal hygiene, Adjust clothing after toileting      Toileting assist Assist level: Supervision or verbal cues   Transfers Chair/bed transfer   Chair/bed transfer method: Stand pivot Chair/bed transfer assist level: Supervision or verbal cues Chair/bed transfer assistive device: Patent attorneyWalker     Locomotion Ambulation      Max distance: 18150ft  Assist level: Touching or steadying assistance (Pt > 75%)   Wheelchair   Type: Manual Max wheelchair distance: 25000ft Assist Level: Supervision or verbal cues  Cognition Comprehension Comprehension assist level: Follows basic conversation/direction with no assist  Expression Expression assist level: Expresses basic needs/ideas: With no assist  Social Interaction Social Interaction assist level: Interacts appropriately with others - No medications needed.  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 DVT Prophylaxis/Anticoagulation: Eliquis. Monitor for any bleeding episodes 3. Pain Management:Tylenol as needed  -heat/ice for low back. Observe for now  -continue muscle relaxant prn 4. Mood:Provide emotional support 5. Neuropsych: This patientiscapable of making decisions on hisown behalf. 6. Skin/Wound Care:Routine skin checks 7. Fluids/Electrolytes/Nutrition:good po  -encourage PO intake.   8.Hypertension. Norvasc 5 mg daily. Improved control, hold on any changes at present Vitals:   12/07/17 2022 12/08/17 0521  BP: (!) 148/86 137/74  Pulse: 84 78  Resp: 20 18  Temp: 98 F (36.7 C) 97.9 F (36.6 C)  SpO2: 96% 97%  controlled monitor 9.Atrial fibrillation. Cardiac rate controlled at present. Continue Eliquis Rate controlled , no signs of bleeding 10.Hyperlipidemia. Lipitor 11.GERD. Protonix     LOS (Days) 4 A FACE TO FACE EVALUATION WAS PERFORMED  Erick ColaceAndrew E Kirsteins, MD 12/08/2017 8:07 AM

## 2017-12-08 NOTE — Progress Notes (Signed)
Occupational Therapy Session Note  Patient Details  Name: Miguel Alvarez MRN: [id number] Date of Birth: 1950-07-06  Today's Date: 12/08/2017 OT Individual Time: 7209-1980 OT Individual Time Calculation (min): 58 min   Short Term Goals: Week 1:  OT Short Term Goal 1 (Week 1): Pt will complete LB dressing with min assist OT Short Term Goal 2 (Week 1): Pt will complete bathing at sit > stand level in shower with min assist OT Short Term Goal 3 (Week 1): Pt will complete toilet transfers with LRAD with supervision OT Short Term Goal 4 (Week 1): Pt will complete 2 grooming tasks in standing for increased activity tolerance  Skilled Therapeutic Interventions/Progress Updates:    Pt greeted in w/c with spouse present. ADL needs met. Started tx with toilet and bed transfer training for pts spouse. Provided her with demonstration and vcs for safe DME mgt for stand pivot technique. She was able to demonstrate understanding afterwards when providing supervision assist during these simulated transfers. Cleared her on safety plan to assist with stand pivots to bed/toilet. For remainder of session, worked on pain mgt for back. Pt reports this interferes a great deal with functional activity. Explored use of hot and cold modalities when he was guided through gentle back, shoulder, and lateral body stretches. Education emphasis placed on finding gentle stretch vs pushing end ranges to prevent strain on muscles. He reported increased pain relief with ice vs heat. Discussed using ice/cold for pain mgt at home, even completing gentle exercises in pool at home, when deemed safe by follow-up therapists. He is an avid golfer, and we talked about stretching prior to activity to decrease back strain (once again, when follow-up therapists say that he is safe to return to this activity). At end of session pt was left in w/c with spouse present. He wanted ice to remain on lower back for pain mgt.    Therapy  Documentation Precautions:  Precautions Precautions: Fall Restrictions Weight Bearing Restrictions: No    ADL:     See Function Navigator for Current Functional Status.   Therapy/Group: Individual Therapy  Doloris Servantes A Finnegan Gatta 12/08/2017, 12:22 PM

## 2017-12-09 ENCOUNTER — Inpatient Hospital Stay (HOSPITAL_COMMUNITY): Payer: Medicare Other

## 2017-12-09 NOTE — Progress Notes (Signed)
Occupational Therapy Session Note  Patient Details  Name: Miguel Alvarez MRN: [id number] Date of Birth: 12/22/1950  Today's Date: 12/09/2017 OT Individual Time: 1530-1643 OT Individual Time Calculation (min): 73 min    Short Term Goals: Week 1:  OT Short Term Goal 1 (Week 1): Pt will complete LB dressing with min assist OT Short Term Goal 2 (Week 1): Pt will complete bathing at sit > stand level in shower with min assist OT Short Term Goal 3 (Week 1): Pt will complete toilet transfers with LRAD with supervision OT Short Term Goal 4 (Week 1): Pt will complete 2 grooming tasks in standing for increased activity tolerance  Skilled Therapeutic Interventions/Progress Updates:    1;1. Pt with no pain reported. Pt ambulates throughout session with RW and supervision with VC for not scissoring legs. Pt completes biodex catch game activity to improve lateral and anterior/posterior weight shifting with min tactile input for weight shifting fading to supervision. Pt completes box and blocks assessment LUE 57 RUE 47 as well as 9HPT LUE 23.3 seconds and RUE 44.7 seconds. Pt completes pill box test with forced use of LUE to manipulate beads for improved FMC/NMR with VC for appropriately using organizer to sort pills. Pt completes palm<>finger manipulation of pennies with VC for using muscles to move coins instead of shaking coins in hand. Pt drops coins ~15% of time. Pt completes standing putting with supervision and MIN A for balance when retrieving golf balls off of floor. Exited session with pt seated in w/c, call light in reach and all needs met  Therapy Documentation Precautions:  Precautions Precautions: Fall Restrictions Weight Bearing Restrictions: No  See Function Navigator for Current Functional Status.   Therapy/Group: Individual Therapy  Stephanie M Schlosser 12/09/2017, 4:18 PM 

## 2017-12-09 NOTE — Progress Notes (Addendum)
Physical Therapy Session Note  Patient Details  Name: Brita Romphomas Farnworth MRN: [id number] Date of Birth: 04/24/1951  Today's Date: 12/09/2017 PT Individual Time: 4098-11910801-0845 and 1300-1410 PT Individual Time Calculation (min): 44 min and 70 min  Short Term Goals: Week 1:  PT Short Term Goal 1 (Week 1): Pt will ambulate 16000ft with min assist and LRAD PT Short Term Goal 2 (Week 1): Pt will perform bed<>WC transfers with supervision assist  PT Short Term Goal 3 (Week 1): Pt will maintain standing balance without UE support and min assist to prepare for pre-gait activities.  PT Short Term Goal 4 (Week 1): Pt will improve berg balance test score by 7 point to indicate reduced fall risk with functional mobility.   Skilled Therapeutic Interventions/Progress Updates:    Session 1: Pt seated in recliner upon PT arrival, agreeable to therapy tx and denies pain. Pt ambulated to the gym with CGA and RW. Session focused on dynamic standing balance and gait training without AD. Pt ambulated 2 x 80 ft without AD, min to mod assist for occasional LOB to the L. Pt participated in Functional Gait Assessment, including items such as gait with head turns, gait with change in speeds, backwards ambulation, tandem walking, and backwards walking with the pt demonstrating ataxia and occasional LOB requiring min-mod assist to correct, pt scored a 11/30 on the FGA, discussed results with pt. Pt worked on dynamic standing balance without UE support including sidestepping in each direction and toe taps on 6 inch step to numbered targets, min-mod assist. Pt ambulated back to room at end of session x 150 ft with RW and CGA. Pt left seated in w/c with needs in reach.   Session 2: Pt seated in recliner upon PT arrival, agreeable to therapy tx and denies pain. Pt's wife present for therapy session, educated pt's wife on recommendations for house modifications, d/c planning, pt's balance deficits and progress. Pt ambulated throughout unit  this session, multiple bouts >200 ft with RW and supervision. Pt worked on high level balance without AD including obstacle course, stepping over objects, standing on compliant surface with eyes open/eyes closed, side stepping, backwards walking, requiring min-mod assist to correct LOB. Pt ambulated from unit<>outside 300+ft in each direction with RW and supervision. Pt worked on Investment banker, operationalgait training outside without AD inlduing ambulation on unlevel surfaces, curb navigation and stair negotiation with single rail, min-mod assist for balance with verbal cues to limit scissoring. Pt ambulated back to unit and performed car transfer with RW and supervision. Pt reports that he feels his L foot roll in sometimes when walking, therapist educated pt and wife on ankle eversion exercises with theraband for lateral ankle strengthening and stability. Pt ambulated back to room at end of session left seated with needs in reach and wife present.     Therapy Documentation Precautions:  Precautions Precautions: Fall Restrictions Weight Bearing Restrictions: No   See Function Navigator for Current Functional Status.   Therapy/Group: Individual Therapy  Cresenciano GenreEmily van Schagen, PT, DPT 12/09/2017, 7:38 AM

## 2017-12-09 NOTE — Progress Notes (Signed)
[place] PHYSICAL MEDICINE & REHABILITATION     PROGRESS NOTE    Subjective/Complaints:  Asking for grounds pass, starting to hear out of left ear, Left facial numbness improving  ROS: denies CP, SOB, N/V/D Objective:  No results found. No results for input(s): WBC, HGB, HCT, PLT in the last 72 hours. No results for input(s): NA, K, CL, GLUCOSE, BUN, CREATININE, CALCIUM in the last 72 hours.  Invalid input(s): CO CBG (last 3)  No results for input(s): GLUCAP in the last 72 hours.  Wt Readings from Last 3 Encounters:  12/04/17 129 kg (284 lb 6.3 oz)  12/02/17 131.9 kg (290 lb 12.6 oz)  10/28/15 (!) 136.5 kg (301 lb)     Intake/Output Summary (Last 24 hours) at 12/09/2017 0741 Last data filed at 12/08/2017 1700 Gross per 24 hour  Intake 600 ml  Output -  Net 600 ml    Vital Signs: Blood pressure (!) 145/76, pulse 68, temperature 97.6 F (36.4 C), temperature source Oral, resp. rate 20, height 6\' 2"  (1.88 m), weight 129 kg (284 lb 6.3 oz), SpO2 98 %. Physical Exam:  Constitutional: No distress . Vital signs reviewed. HEENT: EOMI, oral membranes moist Neck: supple Cardiovascular: RRR without murmur. No JVD    Respiratory: CTA Bilaterally without wheezes or rales. Normal effort    GI: BS +, non-tender, non-distended   Neurological: He isalert. Patient is alert. Speech is fluent. No dysarthria. Follows simple commands. Mild left-sided facial sensory loss, upper quadrant. Continued mild dysmetria LUE and LLE but improving. visual tracking improved Skin: Skin iswarm.  Psychiatric: Cooperative, pleasant Mild dysmetria Left finger nose finger   Assessment/Plan: 1. 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 Physiatrist is providing close team supervision and 24 hour management of active medical problems listed below. Physiatrist and rehab team  continue to assess barriers to discharge/monitor patient progress toward functional and medical goals.  Function:  Bathing Bathing position   Position: Shower  Bathing parts Body parts bathed by patient: Right arm, Left arm, Chest, Abdomen, Front perineal area, Buttocks, Right upper leg, Left upper leg, Right lower leg, Left lower leg, Back    Bathing assist Assist Level: Set up   Set up : To obtain items  Upper Body Dressing/Undressing Upper body dressing   What is the patient wearing?: Pull over shirt/dress     Pull over shirt/dress - Perfomed by patient: Thread/unthread right sleeve, Thread/unthread left sleeve, Put head through opening, Pull shirt over trunk          Upper body assist Assist Level: Set up   Set up : To obtain clothing/put away  Lower Body Dressing/Undressing Lower body dressing   What is the patient wearing?: Underwear, Pants, Socks, Shoes Underwear - Performed by patient: Thread/unthread right underwear leg, Thread/unthread left underwear leg, Pull underwear up/down   Pants- Performed by patient: Thread/unthread right pants leg, Thread/unthread left pants leg, Pull pants up/down       Socks - Performed by patient: Don/doff right sock, Don/doff left sock   Shoes - Performed by patient: Don/doff right shoe, Don/doff left shoe, Fasten right, Fasten left            Lower body assist Assist for lower body dressing: Set up   Set up : To obtain clothing/put away  Toileting Toileting   Toileting steps completed by patient: Adjust clothing prior to toileting, Performs perineal hygiene, Adjust clothing  after toileting      Toileting assist Assist level: Supervision or verbal cues   Transfers Chair/bed transfer   Chair/bed transfer method: Stand pivot Chair/bed transfer assist level: Supervision or verbal cues Chair/bed transfer assistive device: Patent attorney     Max distance: 136ft  Assist level: Touching or steadying  assistance (Pt > 75%)   Wheelchair   Type: Manual Max wheelchair distance: 265ft Assist Level: Supervision or verbal cues  Cognition Comprehension Comprehension assist level: Follows basic conversation/direction with no assist  Expression Expression assist level: Expresses complex ideas: With no assist  Social Interaction Social Interaction assist level: Interacts appropriately with others - No medications needed.  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 DVT Prophylaxis/Anticoagulation: Eliquis. Monitor for any bleeding episodes 3. Pain Management:Tylenol as needed  -heat/ice for low back. Observe for now  -continue muscle relaxant prn 4. Mood:Provide emotional support 5. Neuropsych: This patientiscapable of making decisions on hisown behalf. 6. Skin/Wound Care:Routine skin checks 7. Fluids/Electrolytes/Nutrition:good po  -encourage PO intake.   8.Hypertension. Norvasc 5 mg daily. Improved control, hold on any changes at present Vitals:   12/08/17 1931 12/09/17 0536  BP: 137/84 (!) 145/76  Pulse: 74 68  Resp: 18 20  Temp: 98 F (36.7 C) 97.6 F (36.4 C)  SpO2: 98% 98%  adequately controlled 6/23 9.Atrial fibrillation. Cardiac rate controlled at present. Continue Eliquis Rate controlled , no signs of bleeding 10.Hyperlipidemia. Lipitor 11.GERD. Protonix     LOS (Days) 5 A FACE TO FACE EVALUATION WAS PERFORMED  Erick Colace, MD 12/09/2017 7:41 AM

## 2017-12-10 ENCOUNTER — Inpatient Hospital Stay (HOSPITAL_COMMUNITY): Payer: Medicare Other | Admitting: Physical Therapy

## 2017-12-10 ENCOUNTER — Inpatient Hospital Stay (HOSPITAL_COMMUNITY): Payer: Medicare Other | Admitting: Occupational Therapy

## 2017-12-10 DIAGNOSIS — I480 Paroxysmal atrial fibrillation: Secondary | ICD-10-CM

## 2017-12-10 DIAGNOSIS — I10 Essential (primary) hypertension: Secondary | ICD-10-CM

## 2017-12-10 NOTE — Progress Notes (Signed)
Occupational Therapy Session Note  Patient Details  Name: Brita Romphomas Viloria MRN: [id number] Date of Birth: 10/23/1950  Today's Date: 12/10/2017 OT Individual Time: 4098-11910730-0830 and 1348-1430 OT Individual Time Calculation (min): 60 min and 42 min   Short Term Goals: Week 1:  OT Short Term Goal 1 (Week 1): Pt will complete LB dressing with min assist OT Short Term Goal 2 (Week 1): Pt will complete bathing at sit > stand level in shower with min assist OT Short Term Goal 3 (Week 1): Pt will complete toilet transfers with LRAD with supervision OT Short Term Goal 4 (Week 1): Pt will complete 2 grooming tasks in standing for increased activity tolerance  Skilled Therapeutic Interventions/Progress Updates:   1) Treatment session with focus on dynamic standing balance and LUE gross and fine motor control.  Pt ambulated to therapy gym with RW with supervision.  Engaged in fine and gross motor activities in sitting progressing to standing then to standing on foam surface to challenge balance reactions.  Utilized resistive clothespins to complete 3 jaw chuck, tip to tip, and key pinch while crossing midline and reaching to obtain clothespins.  Engaged in stacking of 1" blocks in sitting and progressing to standing. Pt continues to require visual attention to task to increase success with increased challenge.  Provided pt with fine motor control HEP and educated on recommended activities with pt returning demonstration.  Pt's wife present and verbalizing understanding of recommendations.  Pt ambulated back to room with RW and supervision, with 1 LOB but able to correct without assist.  2) Treatment session with focus on functional mobility in community setting and dynamic balance.  Pt received upright in w/c willing to engage in mobility in community setting of hospital entrance/waiting room.  Pt ambulated 250' x2 with RW with focus on awareness of surroundings and obstacles with uneven flooring and people walking  towards and past him.  Pt reports no increase in dizziness with increase in stimulus during ambulation.  Discussed energy conservation strategies and things to consider when going out in the community post d/c to increase his success with pt and wife reporting understanding and even making suggestions to alter their typical routines to increase success.  Returned to therapy gym and engaged in ball toss with focus on weight shift and reaction time.  Min guard during ball toss due to quick weight shifts outside B[facility].  Therapy Documentation Precautions:  Precautions Precautions: Fall Restrictions Weight Bearing Restrictions: No General:   Vital Signs: Therapy Vitals BP: (!) 145/78 Pain: Pain Assessment Pain Scale: 0-10 Pain Score: 0-No pain   See Function Navigator for Current Functional Status.   Therapy/Group: Individual Therapy  Rosalio LoudHOXIE, Jarmarcus Wambold 12/10/2017, 12:09 PM

## 2017-12-10 NOTE — Progress Notes (Signed)
Physical Therapy Session Note  Patient Details  Name: Miguel Alvarez MRN: [id number] Date of Birth: [birth year]  Today's Date: 12/10/2017 PT Individual Time: 0908-1003 and 1300-1330 PT Individual Time Calculation (min): 55 min and 30 min  Short Term Goals: Week 1:  PT Short Term Goal 1 (Week 1): Pt will ambulate 174f with min assist and LRAD PT Short Term Goal 2 (Week 1): Pt will perform bed<>WC transfers with supervision assist  PT Short Term Goal 3 (Week 1): Pt will maintain standing balance without UE support and min assist to prepare for pre-gait activities.  PT Short Term Goal 4 (Week 1): Pt will improve berg balance test score by 7 point to indicate reduced fall risk with functional mobility.   Skilled Therapeutic Interventions/Progress Updates: Pt presented in w/c agreeable to therapy. Session focused on dynamic balance activities. Ambulated with RW with supervision to rehab gym Participated in [employer] activities including ball toss with feet together/feet apart, pt with x 1 LOB to L with feet together requiring modA from PTA for recovery.  Additional dynamic balance activities included basketball toss on level tile with feet together, staggered stance, and single LE on 4in step. Coordination activities included toe taps to target with cues for purposeful placement of foot. Participated in agility ladder exercises with pt requiring reinforcement of sequencing with one activity. Participated in side stepping with squats and "grapevines" at wall rail for continued coordination and strengthening. Pt ambulated back to room with RW due to increased fatigue and returned to w/c. Pt left in w/c with wife present and needs met.   Tx2: Pt presented in w/c agreeable to therapy. Pt ambulated to day room with RW and close supervision. Participated in Wii fit activities for balance challenges including Tilt board, soccer, and pMassachusetts Mutual Life Able to maintain fair balance with all activities with no LOB. Pt  ambulated to room in same manner as prior and returned to w/c with needs met and wife present.      Therapy Documentation Precautions:  Precautions Precautions: Fall Restrictions Weight Bearing Restrictions: No General:   Vital Signs:  Pain: Pain Assessment Pain Score: 0-No pain  See Function Navigator for Current Functional Status.   Therapy/Group: Individual Therapy  Calypso Hagarty  Jalexa Pifer, PTA  12/10/2017, 12:58 PM

## 2017-12-10 NOTE — Progress Notes (Signed)
[facility] PHYSICAL MEDICINE & REHABILITATION     PROGRESS NOTE    Subjective/Complaints: Patient seen sitting up in his chair working with therapies this morning. He states he slept well overnight and had a good weekend.  ROS: denies CP, SOB, N/V/D  Objective:  No results found. No results for input(s): WBC, HGB, HCT, PLT in the last 72 hours. No results for input(s): NA, K, CL, GLUCOSE, BUN, CREATININE, CALCIUM in the last 72 hours.  Invalid input(s): CO CBG (last 3)  No results for input(s): GLUCAP in the last 72 hours.  Wt Readings from Last 3 Encounters:  12/04/17 129 kg (284 lb 6.3 oz)  12/02/17 131.9 kg (290 lb 12.6 oz)  10/28/15 (!) 136.5 kg (301 lb)     Intake/Output Summary (Last 24 hours) at 12/10/2017 1003 Last data filed at 12/10/2017 0830 Gross per 24 hour  Intake 840 ml  Output 1 ml  Net 839 ml    Vital Signs: Blood pressure (!) 145/78, pulse 61, temperature 97.9 F (36.6 C), temperature source Oral, resp. rate 18, height 6\' 2"  (1.88 m), weight 129 kg (284 lb 6.3 oz), SpO2 97 %. Physical Exam:  Constitutional: No distress . Vital signs reviewed. HENT: Normocephalic.  Atraumatic. Eyes: EOMI. No discharge. Cardiovascular: RRR. No JVD. Respiratory: CTA Bilaterally. Normal effort. GI: BS +. Non-distended. Musc: No edema or tenderness in extremities. Neurological: He isalert. alert. Speech is fluent. No dysarthria.  Follows commands.  Mild left upper extremity dysmetria Skin: Skin iswarm. intact Psychiatric:Cooperative, pleasant  Assessment/Plan: 1. 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 Physiatrist is providing close team supervision and 24 hour management of active medical problems listed below. Physiatrist and rehab team continue to assess barriers to discharge/monitor patient progress toward functional and medical  goals.  Function:  Bathing Bathing position   Position: Shower  Bathing parts Body parts bathed by patient: Right arm, Left arm, Chest, Abdomen, Front perineal area, Buttocks, Right upper leg, Left upper leg, Right lower leg, Left lower leg, Back    Bathing assist Assist Level: Set up   Set up : To obtain items  Upper Body Dressing/Undressing Upper body dressing   What is the patient wearing?: Pull over shirt/dress     Pull over shirt/dress - Perfomed by patient: Thread/unthread right sleeve, Thread/unthread left sleeve, Put head through opening, Pull shirt over trunk          Upper body assist Assist Level: Set up   Set up : To obtain clothing/put away  Lower Body Dressing/Undressing Lower body dressing   What is the patient wearing?: Underwear, Pants, Socks, Shoes Underwear - Performed by patient: Thread/unthread right underwear leg, Thread/unthread left underwear leg, Pull underwear up/down   Pants- Performed by patient: Thread/unthread right pants leg, Thread/unthread left pants leg, Pull pants up/down       Socks - Performed by patient: Don/doff right sock, Don/doff left sock   Shoes - Performed by patient: Don/doff right shoe, Don/doff left shoe, Fasten right, Fasten left            Lower body assist Assist for lower body dressing: Set up   Set up : To obtain clothing/put away  Toileting Toileting   Toileting steps completed by patient: Adjust clothing prior to toileting, Performs perineal hygiene, Adjust clothing after toileting      Toileting assist Assist level: Supervision or verbal cues   Transfers Chair/bed transfer  Chair/bed transfer method: Stand pivot, Ambulatory Chair/bed transfer assist level: Supervision or verbal cues Chair/bed transfer assistive device: Patent attorneyWalker     Locomotion Ambulation     Max distance: 16850ft  Assist level: Supervision or verbal cues   Wheelchair   Type: Manual Max wheelchair distance: 23000ft Assist Level:  Supervision or verbal cues  Cognition Comprehension Comprehension assist level: Follows basic conversation/direction with no assist  Expression Expression assist level: Expresses complex ideas: With no assist  Social Interaction Social Interaction assist level: Interacts appropriately with others - No medications needed.  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 DVT Prophylaxis/Anticoagulation: Eliquis. Monitor for any bleeding episodes 3. Pain Management:Tylenol as needed  Heat/ice for low back.   Continue muscle relaxant prn  Improved 4. Mood:Provide emotional support 5. Neuropsych: This patientiscapable of making decisions on hisown behalf. 6. Skin/Wound Care:Routine skin checks 7. Fluids/Electrolytes/Nutrition:good po  -encourage PO intake.   8.Hypertension. Norvasc 5 mg daily. Vitals:   12/10/17 0511 12/10/17 0855  BP: 125/76 (!) 145/78  Pulse: 61   Resp: 18   Temp: 97.9 F (36.6 C)   SpO2: 97%    Slightly labile on 6/24 9.Atrial fibrillation. Cardiac rate controlled at present. Continue Eliquis 10.Hyperlipidemia. Lipitor 11.GERD. Protonix  LOS (Days) 6 A FACE TO FACE EVALUATION WAS PERFORMED  Ankit Karis JubaAnil Patel, MD 12/10/2017 10:03 AM

## 2017-12-11 ENCOUNTER — Inpatient Hospital Stay (HOSPITAL_COMMUNITY): Payer: Medicare Other | Admitting: Occupational Therapy

## 2017-12-11 ENCOUNTER — Inpatient Hospital Stay (HOSPITAL_COMMUNITY): Payer: Medicare Other | Admitting: Physical Therapy

## 2017-12-11 NOTE — Progress Notes (Signed)
Physical Therapy Session Note  Patient Details  Name: Miguel Alvarez MRN: [id number] Date of Birth: [birth year]  Today's Date: 12/11/2017 PT Individual Time: 0901-0957 PT Individual Time Calculation (min): 56 min   Short Term Goals: Week 1:  PT Short Term Goal 1 (Week 1): Pt will ambulate 131f with min assist and LRAD PT Short Term Goal 2 (Week 1): Pt will perform bed<>WC transfers with supervision assist  PT Short Term Goal 3 (Week 1): Pt will maintain standing balance without UE support and min assist to prepare for pre-gait activities.  PT Short Term Goal 4 (Week 1): Pt will improve berg balance test score by 7 point to indicate reduced fall risk with functional mobility.   Skilled Therapeutic Interventions/Progress Updates: Pt presented in w/c agreeable to therapy. Ambulated throughout unit and performed functional activities for d/c. Activities included car transfer, gait, stairs, and bed mobility. All activities performed at supervision or Mod I level with RW. Pt participated in [employer]with improved score of 49/56. Adv pt although improvement continues to be indicative of a fall risk. Pt also participated in FGA with a score of 18/30, which also demonstrates increased risk of falls. Discussed with pt pacing with activities and to continue to improve on safety awareness with activities. Provided pt edu and demostrated fall recovery which pt was able to complete without assist. Pt returned to room ambulating with RW and returned to w/c. Pt left with call bell within reach and needs met.      Therapy Documentation Precautions:  Precautions Precautions: Fall Restrictions Weight Bearing Restrictions: No General:   Vital Signs: Therapy Vitals Temp: 98 F (36.7 C) Pulse Rate: (!) 56 Resp: 17 BP: 133/78 Patient Position (if appropriate): Lying Oxygen Therapy SpO2: 99 % O2 Device: Room Air Pain: Pain Assessment Pain Score: 0-No pain Mobility:   Locomotion  : Gait Gait Assistance: Supervision/Verbal cueing Gait Distance (Feet): 150 Feet Assistive device: Rolling walker Gait Gait: Yes Gait Pattern: Impaired Gait Pattern: Narrow base of support Stairs / Additional Locomotion Stairs: Yes Stairs Assistance: Supervision/Verbal cueing Stair Management Technique: Two rails Number of Stairs: 12 Height of Stairs: 6 Wheelchair Mobility Wheelchair Mobility: No  Trunk/Postural Assessment : Cervical Assessment Cervical Assessment: Within Functional Limits Thoracic Assessment Thoracic Assessment: Exceptions to WFL(rounded shoulders) Lumbar Assessment Lumbar Assessment: Exceptions to WFL(posterior pelvic tilt) Postural Control Postural Control: Deficits on evaluation  Balance: Balance Balance Assessed: Yes Standardized Balance Assessment Standardized Balance Assessment: Functional Gait Assessment Berg Balance Test Sit to Stand: Able to stand without using hands and stabilize independently Standing Unsupported: Able to stand safely 2 minutes Sitting with Back Unsupported but Feet Supported on Floor or Stool: Able to sit safely and securely 2 minutes Stand to Sit: Sits safely with minimal use of hands Transfers: Able to transfer safely, minor use of hands Standing Unsupported with Eyes Closed: Able to stand 10 seconds with supervision Standing Ubsupported with Feet Together: Able to place feet together independently and stand 1 minute safely From Standing, Reach Forward with Outstretched Arm: Can reach confidently >25 cm (10") From Standing Position, Pick up Object from Floor: Able to pick up shoe safely and easily From Standing Position, Turn to Look Behind Over each Shoulder: Looks behind one side only/other side shows less weight shift Turn 360 Degrees: Able to turn 360 degrees safely one side only in 4 seconds or less Standing Unsupported, Alternately Place Feet on Step/Stool: Able to stand independently and safely and complete 8 steps in  20 seconds Standing  Unsupported, One Foot in Front: Able to plae foot ahead of the other independently and hold 30 seconds Standing on One Leg: Tries to lift leg/unable to hold 3 seconds but remains standing independently Total Score: 49 Dynamic Sitting Balance Dynamic Sitting - Level of Assistance: 6: Modified independent (Device/Increase time) Static Standing Balance Static Standing - Level of Assistance: 6: Modified independent (Device/Increase time) Dynamic Standing Balance Dynamic Standing - Level of Assistance: 5: Stand by assistance Exercises:   Other Treatments:     See Function Navigator for Current Functional Status.   Therapy/Group: Individual Therapy  Amariyah Bazar  Daimon Kean, PTA  12/11/2017, 10:03 AM

## 2017-12-11 NOTE — Progress Notes (Signed)
Occupational Therapy Session Note  Patient Details  Name: Miguel Alvarez MRN: [id number] Date of Birth: 01/01/1951  Today's Date: 12/11/2017 OT Individual Time: [id number], [id number], and [id number] OT Individual Time Calculation (min): 57 min, 45 min, and 25 min   Short Term Goals: Week 1:  OT Short Term Goal 1 (Week 1): Pt will complete LB dressing with min assist OT Short Term Goal 2 (Week 1): Pt will complete bathing at sit > stand level in shower with min assist OT Short Term Goal 3 (Week 1): Pt will complete toilet transfers with LRAD with supervision OT Short Term Goal 4 (Week 1): Pt will complete 2 grooming tasks in standing for increased activity tolerance  Skilled Therapeutic Interventions/Progress Updates:    1) Completed ADL retraining at overall Setup to Mod I level.  Pt ambulated around room with RW to gather clothing prior to ambulating to room shower with supervision.  Completed bathing at sit > stand level Mod I and completed dressing from sit > stand level at Mod I.  Recommend supervision with shower transfers and ambulation with pt reporting understanding.  Ambulated to ADL apt where pt completed toilet and tub/shower transfers with tub bench with supervision.  Pt reported son has already added grab bars into his home shower.  Ambulated to therapy gym with RW with supervision.  Completed box and blocks and 9 hole peg test to assess fine and gross motor control.  Box and Block: Rt: 55 blocks and Lt: 50 blocks; 9 hole peg test: Rt: 24 seconds, Lt: 34 seconds dropping 1 peg and then 25 seconds on second attempt.  Pt reports noticing improvement with fine and gross motor movements functionally as well.  Engaged in dynamic standing activity on foam surface while completing fine motor task at table top.  Returned to room ambulating with RW incorporating head turns for increased challenge.  2) Treatment session with focus on functional mobility in community setting on uneven surfaces.   Pt agreeable to therapy outside.  Therapist pushed pt in w/c for energy conservation.  Engaged in ambulation >300' with RW with supervision around obstacles, on/off community benches, and up/down ramp to parking garage.  Pt reports increased difficulty with slope of ramp and uneven surface but able to complete without any assistance.  Ambulated 100' without AD with min guard - close supervision.  Noted mild decrease in gait speed and step length with Lt foot without AD.  Pt's wife present and observed mobility.  3) Treatment session with focus on visual assessment and LUE gross motor control and standing balance.  Completed visual assessment with pt reporting improvements in blurriness and diplopia resolved.  Nystagmus mostly resolved with mild nystagmus in end range on Rt.  Engaged in ball toss in LUE and from Rt <> Lt hand progressing to completing in standing with min guard when standing.  Discussed carryover of fine motor HEP and previous activities to pt leisure activities of golf and billiards.  Pt reports no further questions or concerns and ready for d/c home.  Therapy Documentation Precautions:  Precautions Precautions: Fall Restrictions Weight Bearing Restrictions: No General:   Vital Signs: Therapy Vitals BP: 133/78 Pain: Pain Assessment Pain Score: 0-No pain  See Function Navigator for Current Functional Status.   Therapy/Group: Individual Therapy  Rosalio LoudHOXIE, Jesten Cappuccio 12/11/2017, 10:54 AM

## 2017-12-11 NOTE — Discharge Summary (Signed)
Discharge summary job [id number]

## 2017-12-11 NOTE — Discharge Summary (Signed)
NAMBrita Romp: Horvath, Semisi MEDICAL RECORD [id number] ACCOUNT [id number] DATE OF BIRTH:July 03, 1950 FACILITY: [facility] LOCATION: [facility] PHYSICIAN:ANKIT PATEL, MD  DISCHARGE SUMMARY  DATE OF DISCHARGE:  12/12/2017  DATE OF ADMISSION:  12/04/2017  DISCHARGE:  12/12/2017   DISCHARGE DIAGNOSES: 1.  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 2.  Deep venous thrombosis prophylaxis with Eliquis. 3.  Hypertension. 4.  Atrial fibrillation. 5.  Hyperlipidemia. 6.  Gastroesophageal reflux disease.  HISTORY OF PRESENT ILLNESS:  This is a 67 year old right-handed male with history of hypertension, hyperlipidemia, atrial fibrillation, maintained on Eliquis and remote tobacco abuse.  He lives with spouse, independent prior to admission.  He presented  to the hospital  [facility] on 12/02/2017 with dizziness, blurred vision and fall while going to the bathroom.  Cranial CT scan negative.  He did not receive tPA.  MRI showed acute posterior fossa infarction centered in the left middle  cerebellar peduncle.  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  Carotid Dopplers with mild bilateral carotid  bifurcation and proximal ICA plaque resulting in less than 50% diameter stenosis.  Echocardiogram with ejection fraction of 65%, no wall motion abnormalities.  Neurology consulted.  Maintained on aspirin as well as Eliquis and plan for discontinuation of  aspirin in 3 months.  Physical and occupational therapy ongoing.  The patient was admitted for comprehensive rehabilitation program.  PAST MEDICAL HISTORY:  See discharge diagnoses.  SOCIAL HISTORY:  Lives with spouse, independent prior to admission.  FUNCTIONAL STATUS:  Upon admission to rehab services, was min mod assist 55 feet rolling walker, moderate assist sit to stand, min mod  assist with activities of daily living.  PHYSICAL EXAMINATION: VITAL SIGNS:  Blood pressure 138/60, pulse 77, temperature 97, respirations 20. GENERAL:  Alert male in no acute distress.  EOMs intact. NECK:  Supple, nontender.  No JVD. CARDIOVASCULAR:  Rate controlled. ABDOMEN:  Soft, nontender.  Good bowel sounds. LUNGS:  Clear to auscultation without wheeze.  He had mild left-sided facial sensory loss.  Horizontal nystagmus.  REHABILITATION HOSPITAL COURSE:  The patient was admitted to inpatient rehabilitation services.  Therapies initiated on a 3-hour daily basis, consisting of physical therapy, occupational therapy and rehabilitation nursing.  The following issues were  addressed during patient's rehabilitation stay:  Pertaining to the patient's left middle cerebellar peduncle infarction, it remained stable, maintained on aspirin and Eliquis.  He would follow up with neurology services.  Blood pressure is controlled  with Norvasc.  No chest pain or shortness of breath.  He continued on Lipitor for hyperlipidemia.  No voiding difficulties.  The patient received weekly collaborative interdisciplinary team conferences to discuss estimated length of stay, family  teaching, any barriers to his discharge.  He was ambulating rolling walker, supervision throughout the rehabilitation therapy unit.  Working with dynamic standing balance up and down stairs with supervision.  He could ambulate back to his room after  sessions, gather belongings for activities of daily living and homemaking.  It was advised no driving.  The patient with excellent overall progress and was discharged to home after family teaching completed.  DISCHARGE MEDICATIONS:  Included Norvasc 5 mg p.o. daily, Eliquis 5 mg p.o. b.i.d., aspirin 325 mg p.o. daily, Lipitor 80 mg p.o. daily, Protonix 40 mg p.o. daily.    Diet was regular.  He would follow up with  Dr. Maryla Morrow at the outpatient rehabilitation service office as directed, Dr.  Gerilyn Pilgrim call for appointment; Dr. Melvyn Neth medical management.  SPECIAL INSTRUCTIONS:  No driving, smoking or alcohol.  TN/NUANCE D:12/11/2017 T:12/11/2017 JOB:[id number]

## 2017-12-11 NOTE — Progress Notes (Signed)
Physical Therapy Discharge Summary  Patient Details  Name: Miguel Alvarez MRN: [id number] Date of Birth: 09-28-1950  Today's Date: 12/11/2017    Patient has met 9 of 9 long term goals due to improved activity tolerance, improved balance, improved postural control, improved attention, improved awareness and improved coordination.  Patient to discharge at an ambulatory level Supervision.   Patient's care partner is independent to provide the necessary physical assistance at discharge.  Reasons goals not met: W/C goal not applicable as d/c at ambulatory status   Recommendation:  Patient will benefit from ongoing skilled PT services in outpatient setting to continue to advance safe functional mobility, address ongoing impairments in gait, balance, safety, endurance, dizziness, and minimize fall risk.  Equipment: RW  Reasons for discharge: treatment goals met  Patient/family agrees with progress made and goals achieved: Yes  PT Discharge   Pain Pain Assessment Pain Score: 0-No pain  Cognition Overall Cognitive Status: Within Functional Limits for tasks assessed Arousal/Alertness: Awake/alert Orientation Level: Oriented X4 Attention: Selective;Alternating Selective Attention: Appears intact Memory: Appears intact Awareness: Appears intact Problem Solving: Impaired Safety/Judgment: Appears intact Motor  Motor Motor: Hemiplegia;Ataxia Motor - Skilled Clinical Observations: mild ataxia LLE   Mobility Bed Mobility Bed Mobility: Supine to Sit;Rolling Right;Rolling Left;Sit to Supine Rolling Right: Independent Rolling Left: Independent Supine to Sit: Independent Sit to Supine: Independent Transfers Transfers: Sit to [employer] Transfers Sit to Stand: Independent with assistive device Stand Pivot Transfers: Independent with assistive device Transfer (Assistive device): Rolling walker Locomotion  Gait Gait Assistance: Supervision/Verbal cueing Gait Distance  (Feet): 150 Feet Assistive device: Rolling walker Gait Gait: Yes Gait Pattern: Impaired Gait Pattern: Narrow base of support Stairs / Additional Locomotion Stairs: Yes Stairs Assistance: Supervision/Verbal cueing Stair Management Technique: Two rails Number of Stairs: 12 Height of Stairs: 6 Wheelchair Mobility Wheelchair Mobility: No  Trunk/Postural Assessment  Cervical Assessment Cervical Assessment: Within Functional Limits Thoracic Assessment Thoracic Assessment: Exceptions to WFL(rounded shoulders) Lumbar Assessment Lumbar Assessment: Exceptions to WFL(posterior pelvic tilt) Postural Control Postural Control: Deficits on evaluation  Balance Balance Balance Assessed: Yes Standardized Balance Assessment Standardized Balance Assessment: Functional Gait Assessment Berg Balance Test Sit to Stand: Able to stand without using hands and stabilize independently Standing Unsupported: Able to stand safely 2 minutes Sitting with Back Unsupported but Feet Supported on Floor or Stool: Able to sit safely and securely 2 minutes Stand to Sit: Sits safely with minimal use of hands Transfers: Able to transfer safely, minor use of hands Standing Unsupported with Eyes Closed: Able to stand 10 seconds with supervision Standing Ubsupported with Feet Together: Able to place feet together independently and stand 1 minute safely From Standing, Reach Forward with Outstretched Arm: Can reach confidently >25 cm (10") From Standing Position, Pick up Object from Floor: Able to pick up shoe safely and easily From Standing Position, Turn to Look Behind Over each Shoulder: Looks behind one side only/other side shows less weight shift Turn 360 Degrees: Able to turn 360 degrees safely one side only in 4 seconds or less Standing Unsupported, Alternately Place Feet on Step/Stool: Able to stand independently and safely and complete 8 steps in 20 seconds Standing Unsupported, One Foot in Front: Able to plae  foot ahead of the other independently and hold 30 seconds Standing on One Leg: Tries to lift leg/unable to hold 3 seconds but remains standing independently Total Score: 49 Dynamic Sitting Balance Dynamic Sitting - Level of Assistance: 6: Modified independent (Device/Increase time) Static Standing Balance Static Standing - Level of  Assistance: 6: Modified independent (Device/Increase time) Dynamic Standing Balance Dynamic Standing - Level of Assistance: 5: Stand by assistance Extremity Assessment      RLE Assessment RLE Assessment: Within Functional Limits LLE Assessment LLE Assessment: Within Functional Limits   See Function Navigator for Current Functional Status.  Rosita DeChalus 12/11/2017, 12:43 PM

## 2017-12-11 NOTE — Progress Notes (Signed)
[facility] PHYSICAL MEDICINE & REHABILITATION     PROGRESS NOTE    Subjective/Complaints: Patient seen sitting up in his chair working with therapies this morning. He states he slept well overnight. He states his looking forward to going home tomorrow.  ROS: denies CP, SOB, N/V/D  Objective:  No results found. No results for input(s): WBC, HGB, HCT, PLT in the last 72 hours. No results for input(s): NA, K, CL, GLUCOSE, BUN, CREATININE, CALCIUM in the last 72 hours.  Invalid input(s): CO CBG (last 3)  No results for input(s): GLUCAP in the last 72 hours.  Wt Readings from Last 3 Encounters:  12/04/17 129 kg (284 lb 6.3 oz)  12/02/17 131.9 kg (290 lb 12.6 oz)  10/28/15 (!) 136.5 kg (301 lb)     Intake/Output Summary (Last 24 hours) at 12/11/2017 0844 Last data filed at 12/11/2017 0700 Gross per 24 hour  Intake 720 ml  Output -  Net 720 ml    Vital Signs: Blood pressure 133/78, pulse (!) 56, temperature 98 F (36.7 C), resp. rate 17, height 6\' 2"  (1.88 m), weight 129 kg (284 lb 6.3 oz), SpO2 99 %. Physical Exam:  Constitutional: No distress . Vital signs reviewed. HENT: Normocephalic.  Atraumatic. Eyes: EOMI. No discharge. Cardiovascular: RRR. No JVD. Respiratory: CTA Bilaterally. Normal effort. GI: BS +. Non-distended. Musc: No edema or tenderness in extremities. Neurological: He isalert. Alert. Speech is fluent. No dysarthria.  Follows commands.  Motor: 5/5 throughout Mild left upper extremity dysmetria Skin: Skin iswarm. intact Psychiatric:Cooperative, pleasant  Assessment/Plan: 1. 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 Physiatrist is providing close team supervision and 24 hour management of active medical problems listed below. Physiatrist and rehab team continue to assess barriers to discharge/monitor patient progress toward  functional and medical goals.  Function:  Bathing Bathing position   Position: Shower  Bathing parts Body parts bathed by patient: Right arm, Left arm, Chest, Abdomen, Front perineal area, Buttocks, Right upper leg, Left upper leg, Right lower leg, Left lower leg, Back    Bathing assist Assist Level: Set up   Set up : To obtain items  Upper Body Dressing/Undressing Upper body dressing   What is the patient wearing?: Pull over shirt/dress     Pull over shirt/dress - Perfomed by patient: Thread/unthread right sleeve, Thread/unthread left sleeve, Put head through opening, Pull shirt over trunk          Upper body assist Assist Level: Set up   Set up : To obtain clothing/put away  Lower Body Dressing/Undressing Lower body dressing   What is the patient wearing?: Underwear, Pants, Socks, Shoes Underwear - Performed by patient: Thread/unthread right underwear leg, Thread/unthread left underwear leg, Pull underwear up/down   Pants- Performed by patient: Thread/unthread right pants leg, Thread/unthread left pants leg, Pull pants up/down       Socks - Performed by patient: Don/doff right sock, Don/doff left sock   Shoes - Performed by patient: Don/doff right shoe, Don/doff left shoe, Fasten right, Fasten left            Lower body assist Assist for lower body dressing: Set up   Set up : To obtain clothing/put away  Toileting Toileting   Toileting steps completed by patient: Adjust clothing prior to toileting, Performs perineal hygiene, Adjust clothing after toileting      Toileting assist Assist level: Supervision or verbal cues  Transfers Chair/bed transfer   Chair/bed transfer method: Stand pivot, Ambulatory Chair/bed transfer assist level: Supervision or verbal cues Chair/bed transfer assistive device: Patent attorney     Max distance: 171ft  Assist level: Supervision or verbal cues   Wheelchair   Type: Manual Max wheelchair distance:  276ft Assist Level: Supervision or verbal cues  Cognition Comprehension Comprehension assist level: Follows basic conversation/direction with no assist  Expression Expression assist level: Expresses complex ideas: With no assist  Social Interaction Social Interaction assist level: Interacts appropriately with others - No medications needed.  Problem Solving Problem solving assist level: Solves basic 90% of the time/requires cueing < 10% of the time  Memory Memory assist level: Recognizes or recalls 75 - 89% of the time/requires cueing 10 - 24% of the time  Medical Problem List and Plan: 1.Decreased functional mobilitysecondary toacute posterior fossa infarct centered in the left middle cerebellar peduncle  Cont CIR, plan for DC tomorrow 2. DVT Prophylaxis/Anticoagulation: Eliquis. Monitor for any bleeding episodes 3. Pain Management:Tylenol as needed  Heat/ice for low back.   Continue muscle relaxant prn  Controlled at present 4. Mood:Provide emotional support 5. Neuropsych: This patientiscapable of making decisions on hisown behalf. 6. Skin/Wound Care:Routine skin checks 7. Fluids/Electrolytes/Nutrition:  -encourage PO intake, good intake.   8.Hypertension. Norvasc 5 mg daily. Vitals:   12/11/17 0605 12/11/17 0832  BP: 137/75 133/78  Pulse: (!) 56   Resp: 17   Temp: 98 F (36.7 C)   SpO2: 99%    Slightly labile , but improving on 6/25 9.Atrial fibrillation. Cardiac rate controlled at present. Continue Eliquis 10.Hyperlipidemia. Lipitor 11.GERD. Protonix  LOS (Days) 7 A FACE TO FACE EVALUATION WAS PERFORMED  Ankit Karis Juba, MD 12/11/2017 8:44 AM

## 2017-12-11 NOTE — Progress Notes (Signed)
Occupational Therapy Discharge Summary  Patient Details  Name: Miguel Alvarez MRN: [id number] Date of Birth: 1951/02/19  Patient has met 9 of 9 long term goals due to improved activity tolerance, improved balance, postural control, ability to compensate for deficits, functional use of  LEFT upper and LEFT lower extremity, improved awareness and improved coordination.  Patient to discharge at overall Supervision to Modified Independent level.  Patient's care partner is independent to provide the necessary intermittent supervision assistance at discharge.    Reasons goals not met: N/A  Recommendation:  Patient will benefit from ongoing skilled OT services in outpatient setting to continue to advance functional skills in the area of BADL, iADL and Reduce care partner burden.  Equipment: tub transfer bench  Reasons for discharge: treatment goals met and discharge from hospital  Patient/family agrees with progress made and goals achieved: Yes  OT Discharge Precautions/Restrictions  Precautions Precautions: Fall General   Vital Signs Therapy Vitals Temp: 98.1 F (36.7 C) Temp Source: Oral Pulse Rate: 68 Resp: 17 BP: (!) 151/73 Patient Position (if appropriate): Sitting Oxygen Therapy SpO2: 98 % O2 Device: Room Air Pain Pain Assessment Pain Scale: 0-10 Pain Score: 0-No pain ADL  See Function Navigator Vision Baseline Vision/History: Wears glasses Wears Glasses: Reading only Patient Visual Report: Blurring of vision(improving) Vision Assessment?: Yes Eye Alignment: Within Functional Limits Ocular Range of Motion: Within Functional Limits Alignment/Gaze Preference: Within Defined Limits Tracking/Visual Pursuits: Able to track stimulus in all quads without difficulty Saccades: Within functional limits Convergence: Within functional limits(reports very mild blurriness with convergence) Visual Fields: No apparent deficits Additional Comments: Diplopia resolved and  Nystagmus nearly resolved - very mild in end range on Rt Perception  Perception: Within Functional Limits Praxis Praxis: Intact Cognition Overall Cognitive Status: Within Functional Limits for tasks assessed Arousal/Alertness: Awake/alert Orientation Level: Oriented X4 Attention: Selective;Alternating Selective Attention: Appears intact Alternating Attention: Appears intact Memory: Appears intact Awareness: Appears intact Problem Solving: Impaired Safety/Judgment: Appears intact Sensation Sensation Light Touch: Appears Intact Stereognosis: Appears Intact Coordination Fine Motor Movements are Fluid and Coordinated: No Finger Nose Finger Test: mild dysmetria in LUE 9 Hole Peg Test: Rt: 24 seconds, Lt: 34 and then 25 seconds (Rt: 25 seconds and Lt: 45 seconds on eval) Motor  Motor Motor: Hemiplegia;Ataxia Motor - Skilled Clinical Observations: mild ataxia LLE  Extremity/Trunk Assessment RUE Assessment RUE Assessment: Within Functional Limits LUE Assessment LUE Assessment: Within Functional Limits General Strength Comments: strength grossly 4/5   See Function Navigator for Current Functional Status.  Simonne Come 12/11/2017, 3:10 PM

## 2017-12-12 ENCOUNTER — Telehealth (HOSPITAL_COMMUNITY): Payer: Self-pay | Admitting: Occupational Therapy

## 2017-12-12 MED ORDER — AMLODIPINE BESYLATE 5 MG PO TABS: 5.0000 mg | ORAL_TABLET | Freq: Every day | ORAL | 0 refills | Status: DC

## 2017-12-12 MED ORDER — PANTOPRAZOLE SODIUM 40 MG PO TBEC: 40.0000 mg | DELAYED_RELEASE_TABLET | Freq: Every day | ORAL | 0 refills | Status: DC

## 2017-12-12 MED ORDER — APIXABAN 5 MG PO TABS: 5.0000 mg | ORAL_TABLET | Freq: Two times a day (BID) | ORAL | 0 refills | Status: AC

## 2017-12-12 MED ORDER — ASPIRIN 325 MG PO TABS: 325.0000 mg | ORAL_TABLET | Freq: Every day | ORAL | Status: AC

## 2017-12-12 MED ORDER — ATORVASTATIN CALCIUM 80 MG PO TABS: 80.0000 mg | ORAL_TABLET | Freq: Every day | ORAL | 1 refills | Status: AC

## 2017-12-12 MED ORDER — METHOCARBAMOL 500 MG PO TABS: 500.0000 mg | ORAL_TABLET | Freq: Four times a day (QID) | ORAL | 0 refills | Status: DC | PRN

## 2017-12-12 NOTE — Progress Notes (Signed)
Social Work  Discharge Note  The overall goal for the admission was met for:   Discharge location: Yes - home with wife able to provide any needed support  Length of Stay: Yes - 8 days  Discharge activity level: Yes - mod independent  Home/community participation: Yes  Services provided included: MD, RD, PT, OT, SLP, RN, TR, Pharmacy and SW  Financial Services: Medicare and Private Insurance: Aetna  Follow-up services arranged: Outpatient: PT, OT via [place], DME: rolling walker, tub bench via [place] and Patient/Family has no preference for HH/DME agencies  Comments (or additional information):  Patient/Family verbalized understanding of follow-up arrangements: Yes  Individual responsible for coordination of the follow-up plan: pt  Confirmed correct DME delivered: Eddith Mentor 12/12/2017    Anikka Marsan

## 2017-12-12 NOTE — Progress Notes (Signed)
[facility] PHYSICAL MEDICINE & REHABILITATION     PROGRESS NOTE    Subjective/Complaints: Patient seen sitting up in his chair and to sleep awaiting discharge. He states he slept well overnight.  ROS: denies CP, SOB, N/V/D  Objective:  No results found. No results for input(s): WBC, HGB, HCT, PLT in the last 72 hours. No results for input(s): NA, K, CL, GLUCOSE, BUN, CREATININE, CALCIUM in the last 72 hours.  Invalid input(s): CO CBG (last 3)  No results for input(s): GLUCAP in the last 72 hours.  Wt Readings from Last 3 Encounters:  12/04/17 129 kg (284 lb 6.3 oz)  12/02/17 131.9 kg (290 lb 12.6 oz)  10/28/15 (!) 136.5 kg (301 lb)     Intake/Output Summary (Last 24 hours) at 12/12/2017 0920 Last data filed at 12/12/2017 0700 Gross per 24 hour  Intake 720 ml  Output -  Net 720 ml    Vital Signs: Blood pressure 136/81, pulse (!) 58, temperature 98.2 F (36.8 C), temperature source Oral, resp. rate 18, height 6\' 2"  (1.88 m), weight 129 kg (284 lb 6.3 oz), SpO2 94 %. Physical Exam:  Constitutional: No distress . Vital signs reviewed. HENT: Normocephalic.  Atraumatic. Eyes: EOMI. No discharge. Cardiovascular: RRR. No JVD. Respiratory: CTA Bilaterally. Normal effort. GI: BS +. Non-distended. Musc: No edema or tenderness in extremities. Neurological: He isalert. Alert. Speech is fluent. No dysarthria.  Follows commands.  Motor: 5/5 throughout (unchanged) Mild left upper extremity dysmetria, improving Skin: Skin iswarm. intact Psychiatric:Cooperative, pleasant  Assessment/Plan: 1. 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 Physiatrist is providing close team supervision and 24 hour management of active medical problems listed below. Physiatrist and rehab team continue to assess barriers to discharge/monitor patient progress toward functional and  medical goals.  Function:  Bathing Bathing position   Position: Shower  Bathing parts Body parts bathed by patient: Right arm, Left arm, Chest, Abdomen, Front perineal area, Buttocks, Right upper leg, Left upper leg, Right lower leg, Left lower leg, Back    Bathing assist Assist Level: Set up   Set up : To obtain items  Upper Body Dressing/Undressing Upper body dressing   What is the patient wearing?: Pull over shirt/dress     Pull over shirt/dress - Perfomed by patient: Thread/unthread right sleeve, Thread/unthread left sleeve, Put head through opening, Pull shirt over trunk          Upper body assist Assist Level: No help, No cues   Set up : To obtain clothing/put away  Lower Body Dressing/Undressing Lower body dressing   What is the patient wearing?: Underwear, Pants, Socks, Shoes Underwear - Performed by patient: Thread/unthread right underwear leg, Thread/unthread left underwear leg, Pull underwear up/down   Pants- Performed by patient: Thread/unthread right pants leg, Thread/unthread left pants leg, Pull pants up/down       Socks - Performed by patient: Don/doff right sock, Don/doff left sock   Shoes - Performed by patient: Don/doff right shoe, Don/doff left shoe, Fasten right, Fasten left            Lower body assist Assist for lower body dressing: No Help, No cues, Assistive device Assistive Device Comment: sit > stand from tub bench Set up : To obtain clothing/put away  Toileting Toileting   Toileting steps completed by patient: Adjust clothing prior to toileting, Performs perineal hygiene, Adjust clothing after toileting      Toileting  assist Assist level: No help/no cues   Transfers Chair/bed transfer   Chair/bed transfer method: Stand pivot, Ambulatory Chair/bed transfer assist level: No Help, no cues, assistive device, takes more than a reasonable amount of time Chair/bed transfer assistive device: Patent attorneyWalker     Locomotion Ambulation     Max  distance: 12650ft Assist level: Supervision or verbal cues   Wheelchair   Type: Manual Max wheelchair distance: 25200ft Assist Level: Supervision or verbal cues  Cognition Comprehension Comprehension assist level: Understands complex 90% of the time/cues 10% of the time  Expression Expression assist level: Expresses complex ideas: With no assist  Social Interaction Social Interaction assist level: Interacts appropriately with others - No medications needed.  Problem Solving Problem solving assist level: Solves complex 90% of the time/cues < 10% of the time  Memory Memory assist level: Recognizes or recalls 90% of the time/requires cueing < 10% of the time  Medical Problem List and Plan: 1.Decreased functional mobilitysecondary toacute posterior fossa infarct centered in the left middle cerebellar peduncle  D/c today  Will see patient for transitional care management in 1-2 weeks 2. DVT Prophylaxis/Anticoagulation: Eliquis. Monitor for any bleeding episodes 3. Pain Management:Tylenol as needed  Heat/ice for low back.   Continue muscle relaxant prn  Controlled at present 4. Mood:Provide emotional support 5. Neuropsych: This patientiscapable of making decisions on hisown behalf. 6. Skin/Wound Care:Routine skin checks 7. Fluids/Electrolytes/Nutrition:  -encourage PO intake, good intake.   8.Hypertension. Norvasc 5 mg daily. Vitals:   12/11/17 2148 12/12/17 0612  BP: 133/65 136/81  Pulse: (!) 43 (!) 58  Resp: 17 18  Temp: 98.3 F (36.8 C) 98.2 F (36.8 C)  SpO2: 99% 94%   Relatively controlled on 6/26 9.Atrial fibrillation. Cardiac rate controlled at present. Continue Eliquis 10.Hyperlipidemia. Lipitor 11.GERD. Protonix  LOS (Days) 8 A FACE TO FACE EVALUATION WAS PERFORMED  Ankit Karis JubaAnil Patel, MD 12/12/2017 9:20 AM

## 2017-12-12 NOTE — Telephone Encounter (Signed)
Nacny made these appts under my employee number because she took the call at my desk WT

## 2017-12-13 ENCOUNTER — Telehealth (HOSPITAL_COMMUNITY): Payer: Self-pay | Admitting: Occupational Therapy

## 2017-12-13 NOTE — Telephone Encounter (Signed)
Called Rutledge[place]cy [place]oyle, [place]'s appointments. NF 12/13/17

## 2017-12-14 ENCOUNTER — Telehealth: Payer: Self-pay

## 2017-12-14 NOTE — Telephone Encounter (Signed)
Transitional Care call-wife    1. Are you/is patient experiencing any problems since coming home?No Are there any questions regarding any aspect of care?No 2. Are there any questions regarding medications administration/dosing?No Are meds being taken as prescribed?Yes Patient should review meds with caller to confirm 3. Have there been any falls?No 4. Has Home Health been to the house and/or have they contacted you?Yes, [facility] at [facility] If not, have you tried to contact them? Can we help you contact them? 5. Are bowels and bladder emptying properly? Yes Are there any unexpected incontinence issues?No If applicable, is patient following bowel/bladder programs? 6. Any fevers, problems with breathing, unexpected pain?No 7. Are there any skin problems or new areas of breakdown?No 8. Has the patient/family member arranged specialty MD follow up (ie cardiology/neurology/renal/surgical/etc)?Yes  Can we help arrange? 9. Does the patient need any other services or support that we can help arrange?No 10. Are caregivers following through as expected in assisting the patient?Yes 11. Has the patient quit smoking, drinking alcohol, or using drugs as recommended?Yes  Appointment time, 12/25/17 arrive time 1:40 for 2:00 appt [address]

## 2017-12-18 ENCOUNTER — Encounter (HOSPITAL_COMMUNITY): Payer: Self-pay | Admitting: Occupational Therapy

## 2017-12-18 ENCOUNTER — Other Ambulatory Visit: Payer: Self-pay

## 2017-12-18 ENCOUNTER — Ambulatory Visit (HOSPITAL_COMMUNITY): Payer: Medicare Other | Attending: Physical Medicine & Rehabilitation | Admitting: Occupational Therapy

## 2017-12-18 DIAGNOSIS — I63512 Cerebral infarction due to unspecified occlusion or stenosis of left middle cerebral artery: Secondary | ICD-10-CM | POA: Diagnosis present

## 2017-12-18 DIAGNOSIS — R2681 Unsteadiness on feet: Secondary | ICD-10-CM | POA: Diagnosis present

## 2017-12-18 DIAGNOSIS — R278 Other lack of coordination: Secondary | ICD-10-CM | POA: Diagnosis present

## 2017-12-18 NOTE — Therapy (Signed)
[facility] [address] [place], [place], [address] Phone: [phone]   Fax:  [phone]  Occupational Therapy Evaluation  Patient Details  Name: Miguel Alvarez MRN: [id number] Date of Birth: [birth year] Referring Provider: Dr. Maryla Morrow   Encounter Date: 12/18/2017  OT End of Session - 12/18/17 1108    Visit Number  1    Number of Visits  1    Date for OT Re-Evaluation  12/19/17    Authorization Type  1) Medicare A & B 2) Aetna    OT Start Time  1032    OT Stop Time  1059    OT Time Calculation (min)  27 min    Activity Tolerance  Patient tolerated treatment well    Behavior During Therapy  [facility] for tasks assessed/performed       Past Medical History:  Diagnosis Date  . Arthritis    osteoarthritis  . Atrial fibrillation (HCC)   . GERD (gastroesophageal reflux disease)     Past Surgical History:  Procedure Laterality Date  . COLONOSCOPY  pandya   6-7 years ago, some polyps  . COLONOSCOPY WITH PROPOFOL N/A 10/28/2015   Procedure: COLONOSCOPY WITH PROPOFOL;  Surgeon: Corbin Ade, MD;  Location: [facility];  Service: Endoscopy;  Laterality: N/A;  0730  . ESOPHAGOGASTRODUODENOSCOPY     Barrett's esophagus per patient  . ESOPHAGOGASTRODUODENOSCOPY (EGD) WITH PROPOFOL N/A 10/28/2015   Procedure: ESOPHAGOGASTRODUODENOSCOPY (EGD) WITH PROPOFOL;  Surgeon: Corbin Ade, MD;  Location: [facility];  Service: Endoscopy;  Laterality: N/A;  . removal of bone foot Left    age 35    There were no vitals filed for this visit.  Subjective Assessment - 12/18/17 1103    Subjective   S: My balance is my main issue.     Patient is accompained by:  Family member wife    Pertinent History  Miguel Alvarez is a 67 y/o male s/p CVA on 12/02/17 presenting for evaluation. Miguel Alvarez received rehab services at [facility] before discharging home at supervision/mod I level. Miguel Alvarez was referred to occupational therapy for evaluation and treatment by Dr. Maryla Morrow.     Special Tests  FOTO 64.5/100    Patient Stated Goals  To return to my usual activities    Currently in Pain?  No/denies        [facility] OT Assessment - 12/18/17 1032      Assessment   Medical Diagnosis  s/p cerebellar CVA    Referring Provider  Dr. Maryla Morrow    Onset Date/Surgical Date  12/02/17    Hand Dominance  Right    Prior Therapy  [facility]      Precautions   Precautions  None      Balance Screen   Has the patient fallen in the past 6 months  No    Has the patient had a decrease in activity level because of a fear of falling?   No    Is the patient reluctant to leave their home because of a fear of falling?   No      Prior Function   Level of Independence  Independent with basic ADLs    Vocation  Retired    Leisure  looks after [employer], golf, pool, outdoor work      ADL   ADL comments  Miguel Alvarez is requiring increased time for ADL completion requiring fine motor skills like tying shoes. Miguel Alvarez occasionally misses items when he reaches for  them      Written Expression   Dominant Hand  Right      Vision - History   Baseline Vision  No visual deficits      Cognition   Overall Cognitive Status  Within Functional Limits for tasks assessed      Observation/Other Assessments   Focus on Therapeutic Outcomes (FOTO)   64.5/100      Sensation   Light Touch  Appears Intact      Coordination   Gross Motor Movements are Fluid and Coordinated  Yes    Fine Motor Movements are Fluid and Coordinated  Yes    9 Hole Peg Test  Right;Left    Right 9 Hole Peg Test  23"    Left 9 Hole Peg Test  32"      ROM / Strength   AROM / PROM / Strength  Strength      Strength   Overall Strength Comments  RUE strength is 5/5 throughout    Strength Assessment Site  Hand;Shoulder    Right/Left Shoulder  Right;Left    Right Shoulder Flexion  5/5    Right Shoulder ABduction  5/5    Right Shoulder Internal Rotation  5/5    Right Shoulder External Rotation  5/5    Left Shoulder Flexion  5/5     Left Shoulder ABduction  5/5    Left Shoulder Internal Rotation  5/5    Left Shoulder External Rotation  5/5    Right/Left hand  Right;Left    Right Hand Grip (lbs)  105    Right Hand Lateral Pinch  26 lbs    Right Hand 3 Point Pinch  26 lbs    Left Hand Grip (lbs)  105    Left Hand Lateral Pinch  28 lbs    Left Hand 3 Point Pinch  24 lbs                      OT Education - 12/18/17 1106    Education Details  reviewed fine motor exercises provided by CIR and educated on completion    Person(s) Educated  Patient;Spouse    Methods  Explanation    Comprehension  Verbalized understanding                 Plan - 12/18/17 1109    Clinical Impression Statement  A: Miguel Alvarez is a 67 y/o male s/p cerebellar CVA on 12/02/17. Miguel Alvarez's primary concern at this time is his balance, he is completing ADLs at mod I level occasionally requiring increased time. Miguel Alvarez demonstrates strength WNL, coordination is intact, left fine motor is slightly slower than right. Miguel Alvarez is agreeable to home exercises to continue improving his fine motor coordination required for B/IADL completion.     Occupational Profile and client history currently impacting functional performance  Miguel Alvarez is very active at baseline and is motivated to return to his typical routine and tasks.     Occupational performance deficits (Please refer to evaluation for details):  ADL's;IADL's;Work;Leisure    Rehab Potential  Good    OT Frequency  One time visit    OT Treatment/Interventions  Therapeutic exercise;Therapeutic activities    Plan  P: HEP to further improve fine motor skills-Miguel Alvarez is agreeable    Clinical Decision Making  Limited treatment options, no task modification necessary    OT Home Exercise Plan  fine motor coordination    Consulted and Agree with Plan of Care  Patient;Family member/caregiver  Family Member Consulted  wife       Patient will benefit from skilled therapeutic intervention in order to improve the  following deficits and impairments:  Decreased coordination  Visit Diagnosis: Left middle cerebral artery stroke [facility](HCC)  Other lack of coordination    Problem List Patient Active Problem List   Diagnosis Date Noted  . Benign essential HTN   . PAF (paroxysmal atrial fibrillation) (HCC)   . Cerebellar infarct (HCC) 12/05/2017  . Dizziness 12/02/2017  . Hyperlipidemia 12/02/2017  . Acute CVA (cerebrovascular accident) (HCC) 12/02/2017  . Atrial fibrillation, chronic (HCC) 12/02/2017  . Mucosal abnormality of esophagus   . Diverticulosis of colon without hemorrhage   . Blood in stool   . Barrett's esophagus 09/03/2015  . GERD (gastroesophageal reflux disease) 09/03/2015  . Normocytic anemia 09/03/2015  . Heme positive stool 09/03/2015   Ezra SitesLeslie Raphaela Cannaday, OTR/L  [phone] 12/18/2017, 11:14 AM  [facility] [address] [place], [place], [address] Phone: [phone]   Fax:  [phone]  Name: Brita Romphomas Haak MRN: [id number] Date of Birth: 10/18/1950

## 2017-12-25 ENCOUNTER — Ambulatory Visit (HOSPITAL_COMMUNITY): Payer: Medicare Other

## 2017-12-25 ENCOUNTER — Encounter (HOSPITAL_COMMUNITY): Payer: Self-pay

## 2017-12-25 ENCOUNTER — Other Ambulatory Visit: Payer: Self-pay

## 2017-12-25 ENCOUNTER — Encounter: Payer: Medicare Other | Attending: Registered Nurse | Admitting: Registered Nurse

## 2017-12-25 ENCOUNTER — Encounter: Payer: Self-pay | Admitting: Registered Nurse

## 2017-12-25 VITALS — BP 134/80 | HR 76 | Ht 74.0 in | Wt 280.6 lb

## 2017-12-25 DIAGNOSIS — Z7901 Long term (current) use of anticoagulants: Secondary | ICD-10-CM | POA: Diagnosis not present

## 2017-12-25 DIAGNOSIS — E7849 Other hyperlipidemia: Secondary | ICD-10-CM | POA: Diagnosis not present

## 2017-12-25 DIAGNOSIS — E785 Hyperlipidemia, unspecified: Secondary | ICD-10-CM | POA: Diagnosis not present

## 2017-12-25 DIAGNOSIS — Z09 Encounter for follow-up examination after completed treatment for conditions other than malignant neoplasm: Secondary | ICD-10-CM | POA: Diagnosis not present

## 2017-12-25 DIAGNOSIS — I63512 Cerebral infarction due to unspecified occlusion or stenosis of left middle cerebral artery: Secondary | ICD-10-CM | POA: Diagnosis not present

## 2017-12-25 DIAGNOSIS — Z87891 Personal history of nicotine dependence: Secondary | ICD-10-CM | POA: Diagnosis not present

## 2017-12-25 DIAGNOSIS — I639 Cerebral infarction, unspecified: Secondary | ICD-10-CM | POA: Diagnosis not present

## 2017-12-25 DIAGNOSIS — I10 Essential (primary) hypertension: Secondary | ICD-10-CM | POA: Diagnosis not present

## 2017-12-25 DIAGNOSIS — R2681 Unsteadiness on feet: Secondary | ICD-10-CM

## 2017-12-25 DIAGNOSIS — Z9889 Other specified postprocedural states: Secondary | ICD-10-CM | POA: Diagnosis not present

## 2017-12-25 DIAGNOSIS — I480 Paroxysmal atrial fibrillation: Secondary | ICD-10-CM | POA: Insufficient documentation

## 2017-12-25 DIAGNOSIS — M1990 Unspecified osteoarthritis, unspecified site: Secondary | ICD-10-CM | POA: Insufficient documentation

## 2017-12-25 NOTE — Therapy (Signed)
[facility] [address] [place], [place], [address] Phone: [phone]   Fax:  [phone]  Physical Therapy Evaluation  Patient Details  Name: Miguel Alvarez MRN: [id number] Date of Birth: 1950-11-01 Referring Provider: Marcello Fennel, MD   Encounter Date: 12/25/2017  PT End of Session - 12/25/17 1206    Visit Number  1    Number of Visits  13    Date for PT Re-Evaluation  01/22/18 mini reassess 01/09/18    Authorization Type  Medicare (Secondary: Dispensing optician)    Authorization Time Period  12/25/17 to 01/22/18    Authorization - Visit Number  1    Authorization - Number of Visits  10    PT Start Time  1030    PT Stop Time  1109    PT Time Calculation (min)  39 min    Equipment Utilized During Treatment  Gait belt    Activity Tolerance  Patient tolerated treatment well    Behavior During Therapy  [facility] for tasks assessed/performed       Past Medical History:  Diagnosis Date  . Arthritis    osteoarthritis  . Atrial fibrillation (HCC)   . GERD (gastroesophageal reflux disease)     Past Surgical History:  Procedure Laterality Date  . COLONOSCOPY  pandya   6-7 years ago, some polyps  . COLONOSCOPY WITH PROPOFOL N/A 10/28/2015   Procedure: COLONOSCOPY WITH PROPOFOL;  Surgeon: Corbin Ade, MD;  [facility];  Service: Endoscopy;  Laterality: N/A;  0730  . ESOPHAGOGASTRODUODENOSCOPY     Barrett's esophagus per patient  . ESOPHAGOGASTRODUODENOSCOPY (EGD) WITH PROPOFOL N/A 10/28/2015   Procedure: ESOPHAGOGASTRODUODENOSCOPY (EGD) WITH PROPOFOL;  Surgeon: Corbin Ade, MD;  [facility];  Service: Endoscopy;  Laterality: N/A;  . removal of bone foot Left    age 67    There were no vitals filed for this visit.   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 He went to CIR for 7-10 days. He is still having some numbness on his entire L  side is still numb. He reports that his L foot won't do what he wants it to do. He reports that he is mainly having issues with his balance. He denies any spinning sensations but states that he feels like he is in a cloud or something. He denies any falls recently, but has had close calls. He was using a RW but is not using any AD currently. 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     Limitations  Walking    How long can you sit comfortably?  no issues    How long can you stand comfortably?  1 min    How long can you walk comfortably?  5-10 mins    Patient Stated Goals  get back where I was at if possible    Currently in Pain?  No/denies         [facility] PT Assessment - 12/25/17 0001      Assessment   Medical Diagnosis  L Cerebellar CVA    Referring Provider  Ankit Karis Juba, MD    Onset Date/Surgical Date  12/02/17    Hand Dominance  Right    Next MD Visit  12/25/17    Prior Therapy  CIR  Prior Function   Level of Independence  Independent with basic ADLs    Vocation  Retired    Leisure  looks after [employer], golf, pool, outdoor work      Observation/Other Assessments   Focus on Therapeutic Outcomes (FOTO)   to be completed next visit      Sensation   Light Touch  Appears Intact      Coordination   Finger Nose Finger Test  LLE proprioception WNL    Heel Shin Test  subjective reports of increased diffiiculty with LLE but able to perform test WNL    9 Hole Peg Test  RAMPS (alternating toe tapping) WNL      Functional Tests   Functional tests  Sit to Stand      Sit to Stand   Comments  30 sec chair rise test: 13      ROM / Strength   AROM / PROM / Strength  Strength      Strength   Strength Assessment Site  Hip;Knee;Ankle    Right Hip Flexion  5/5    Right Hip Extension  4+/5    Right Hip ABduction  4+/5    Left Hip Flexion  5/5    Left Hip Extension  4/5    Left  Hip ABduction  4+/5    Right Knee Flexion  5/5    Right Knee Extension  5/5    Left Knee Flexion  5/5    Left Knee Extension  5/5    Right Ankle Dorsiflexion  5/5    Left Ankle Dorsiflexion  5/5      Ambulation/Gait   Ambulation Distance (Feet)  712 Feet    Assistive device  None    Gait Pattern  Step-through pattern intermittent staggering L      Balance   Balance Assessed  Yes      Static Standing Balance   Static Standing - Balance Support  No upper extremity supported    Static Standing Balance -  Activities   Single Leg Stance - Right Leg;Single Leg Stance - Left Leg    Static Standing - Comment/# of Minutes  R: 10sec  L: 2.5 sec      Standardized Balance Assessment   Standardized Balance Assessment  Dynamic Gait Index      Dynamic Gait Index   Level Surface  Normal    Change in Gait Speed  Mild Impairment    Gait with Horizontal Head Turns  Moderate Impairment    Gait with Vertical Head Turns  Mild Impairment    Gait and Pivot Turn  Normal    Step Over Obstacle  Normal    Step Around Obstacles  Mild Impairment    Steps  Mild Impairment    Total Score  18      Functional Gait  Assessment   Gait assessed   Yes    Gait Level Surface  Walks 20 ft in less than 5.5 sec, no assistive devices, good speed, no evidence for imbalance, normal gait pattern, deviates no more than 6 in outside of the 12 in walkway width.    Change in Gait Speed  Able to change speed, demonstrates mild gait deviations, deviates 6-10 in outside of the 12 in walkway width, or no gait deviations, unable to achieve a major change in velocity, or uses a change in velocity, or uses an assistive device.    Gait with Horizontal Head Turns  Performs head turns with moderate  changes in gait velocity, slows down, deviates 10-15 in outside 12 in walkway width but recovers, can continue to walk.    Gait with Vertical Head Turns  Performs task with slight change in gait velocity (eg, minor disruption to smooth  gait path), deviates 6 - 10 in outside 12 in walkway width or uses assistive device    Gait and Pivot Turn  Pivot turns safely within 3 sec and stops quickly with no loss of balance.    Step Over Obstacle  Is able to step over 2 stacked shoe boxes taped together (9 in total height) without changing gait speed. No evidence of imbalance.    Gait with Narrow Base of Support  Ambulates less than 4 steps heel to toe or cannot perform without assistance.    Gait with Eyes Closed  Walks 20 ft, uses assistive device, slower speed, mild gait deviations, deviates 6-10 in outside 12 in walkway width. Ambulates 20 ft in less than 9 sec but greater than 7 sec.    Ambulating Backwards  Walks 20 ft, no assistive devices, good speed, no evidence for imbalance, normal gait    Steps  Alternating feet, must use rail.    Total Score  21         Objective measurements completed on examination: See above findings.       PT Education - 12/25/17 1110    Education Details  exam findings, POC, HEP    Person(s) Educated  Patient    Methods  Explanation;Demonstration;Handout    Comprehension  Verbalized understanding;Returned demonstration       PT Short Term Goals - 12/25/17 1213      PT SHORT TERM GOAL #1   Title  Pt will have 1/2 grade improvement throughout proximal hip musculature in order to maximzie gait and balance.    Time  2    Period  Weeks    Status  New    Target Date  01/08/18      PT SHORT TERM GOAL #2   Title  Pt will be able to perform L SLS for 10 sec in order to demo improved balance and maximzie gait.    Time  2    Period  Weeks    Status  New      PT SHORT TERM GOAL #3   Title  Pt will be score 20/24 or > on the DGI in order to demo improved dynamic balance and maximize his community ambulation and gait on uneven ground.    Time  2    Period  Weeks    Status  New      PT SHORT TERM GOAL #4   Title  --        PT Long Term Goals - 12/25/17 1216      PT LONG TERM GOAL #1    Title  Pt will score 25/30 or > on the FGA in order to demo further improved dynamic balance and decrease risk for falls.    Time  4    Period  Weeks    Status  New    Target Date  01/22/18      PT LONG TERM GOAL #2   Title  Pt will be able to perform bil SLS for 20 sec in order to maximize gait on uneven ground and decrease risk for falls.    Time  4    Period  Weeks    Status  New  PT LONG TERM GOAL #3   Title  Pt will have 114ft improvement during without evidence of any staggering in order to demo improved gait and maximize his ability to return to his regular daily walking program.    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    Time  4    Period  Weeks    Status  New             Plan - 12/25/17 1207    Clinical Impression Statement  Pt is pleasant 66YO M who sustained L cerebellar CVA on 12/02/17 and presents to OPPT with c/o balance which is affecting his IADLs and ability to perform functional tasks at home. Pt's MMT only slightly impaired at proximal hips but his major impairments are in SLS, NBOS balance, gait, and dynamic gait. Pt scored 18/24 on the DGI and 21/30 on the FGA, indicating he is at medium risk for falls. During tandem gait when performing the FGA, pt significantly unbalanced and required UE support at wall to prevent.  Pt noted to intermittently stagger to the L during . Pt highly motivated to participate with PT and needs skilled intervention to address these impairments in order to improve his balance so that he can return to his IADLs and PLOF.     Clinical Presentation  Stable    Clinical Presentation due to:  balance, functional strength, gait, dynamic balance,     Clinical Decision Making  Low     Rehab Potential  Excellent    PT Frequency  3x / week    PT Duration  4 weeks    PT Treatment/Interventions  ADLs/Self Care Home Management;Cryotherapy;Electrical Stimulation;Biofeedback;Canalith Repostioning;Moist Heat;Traction;Ultrasound;DME Instruction;Gait training;Stair training;Functional mobility training;Therapeutic activities;Therapeutic exercise;Balance training;Neuromuscular re-education;Patient/family education;Orthotic Fit/Training;Manual techniques;Passive range of motion;Dry needling;Energy conservation;Taping;Vestibular    PT Next Visit Plan  review goals; initiate functional strengthening, begin balance training and progress as able    PT Home Exercise Plan  eval: tandem stance firm surface in corner    Consulted and Agree with Plan of Care  Patient       Patient will benefit from skilled therapeutic intervention in order to improve the following deficits and impairments:  Decreased balance, Decreased mobility, Decreased strength, Difficulty walking, Decreased endurance, Decreased activity tolerance, Hypomobility, Improper body mechanics  Visit Diagnosis: Unsteadiness on feet - Plan: PT plan of care cert/re-cert  Left middle cerebral artery stroke [facility]) - Plan: PT plan of care cert/re-cert     Problem List Patient Active Problem List   Diagnosis Date Noted  . Benign essential HTN   . PAF (paroxysmal atrial fibrillation) (HCC)   . Cerebellar infarct (HCC) 12/05/2017  . Dizziness 12/02/2017  . Hyperlipidemia 12/02/2017  . Acute CVA (cerebrovascular accident) (HCC) 12/02/2017  . Atrial fibrillation, chronic (HCC) 12/02/2017  . Mucosal abnormality of esophagus   . Diverticulosis of colon without hemorrhage   . Blood in stool   . Barrett's esophagus 09/03/2015  . GERD (gastroesophageal reflux disease) 09/03/2015  . Normocytic anemia 09/03/2015  . Heme positive stool 09/03/2015       Jac Canavan PT, DPT  [facility] [address] [place], [place], [address] Phone: [phone]   Fax:  [phone]  Name: Brita Romphomas Dalton MRN: [id number] Date of Birth: 07/09/1950

## 2017-12-25 NOTE — Progress Notes (Signed)
Subjective:    Patient ID: Miguel Alvarez, male    DOB: 02/16/1951, 67 y.o.   MRN: [id number]  HPI: [name] is a 67 year old male who is here for Transitional Care Visit in follow up of his CVA, HTN, PAF and Hyperlipidemia. He has been home receiving Outpatient Therapies at [facility]. He denies any pain at this time. He rated his pain 4.   Also reports good appetite.   Wife in the room all questions answered.    Pain Inventory Average Pain 4 Pain Right Now 4 My pain is n/a  In the last 24 hours, has pain interfered with the following? General activity 0 Relation with others 0 Enjoyment of life 0 What TIME of day is your pain at its worst? morning Sleep (in general) Fair  Pain is worse with: standing Pain improves with: rest Relief from Meds: 6  Mobility how many minutes can you walk? 10  Function retired  Neuro/Psych numbness trouble walking dizziness  Prior Studies Any changes since last visit?  no  Physicians involved in your care Any changes since last visit?  no   Family History  Problem Relation Age of Onset  . Colon cancer Neg Hx    Social History   Socioeconomic History  . Marital status: Married    Spouse name: Not on file  . Number of children: 2  . Years of education: Not on file  . Highest education level: Not on file  Occupational History  . Occupation: used to run night club  Social Needs  . Financial resource strain: Not on file  . Food insecurity:    Worry: Not on file    Inability: Not on file  . Transportation needs:    Medical: Not on file    Non-medical: Not on file  Tobacco Use  . Smoking status: Former Smoker    Packs/day: 1.00    Years: 40.00    Pack years: 40.00    Types: Cigarettes    Last attempt to quit: 09/03/2007    Years since quitting: 10.3  . Smokeless tobacco: Never Used  Substance and Sexual Activity  . Alcohol use: Not Currently    Alcohol/week: 7.2 oz    Types: 12 Cans of beer per week   Comment: beer, two nights a week (6-8 beers per night).  used to drink more  . Drug use: No  . Sexual activity: Not on file  Lifestyle  . Physical activity:    Days per week: Not on file    Minutes per session: Not on file  . Stress: Not on file  Relationships  . Social connections:    Talks on phone: Not on file    Gets together: Not on file    Attends religious service: Not on file    Active member of club or organization: Not on file    Attends meetings of clubs or organizations: Not on file    Relationship status: Not on file  Other Topics Concern  . Not on file  Social History Narrative  . Not on file   Past Surgical History:  Procedure Laterality Date  . COLONOSCOPY  pandya   6-7 years ago, some polyps  . COLONOSCOPY WITH PROPOFOL N/A 10/28/2015   Procedure: COLONOSCOPY WITH PROPOFOL;  Surgeon: Corbin Adeobert M Rourk, MD;  [facility];  Service: Endoscopy;  Laterality: N/A;  0730  . ESOPHAGOGASTRODUODENOSCOPY     Barrett's esophagus per patient  . ESOPHAGOGASTRODUODENOSCOPY (EGD)  WITH PROPOFOL N/A 10/28/2015   Procedure: ESOPHAGOGASTRODUODENOSCOPY (EGD) WITH PROPOFOL;  Surgeon: Corbin Ade, MD;  [facility];  Service: Endoscopy;  Laterality: N/A;  . removal of bone foot Left    age 75   Past Medical History:  Diagnosis Date  . Arthritis    osteoarthritis  . Atrial fibrillation (HCC)   . GERD (gastroesophageal reflux disease)    BP 134/80   Pulse 76   Ht 6\' 2"  (1.88 m)   Wt 280 lb 9.6 oz (127.3 kg)   SpO2 96%   BMI 36.03 kg/m   Opioid Risk Score:   Fall Risk Score:  `1  Depression screen PHQ 2/9  Depression screen PHQ 2/9 12/25/2017  Decreased Interest 0  Down, Depressed, Hopeless 3  PHQ - 2 Score 3  Altered sleeping 3  Tired, decreased energy 3  Change in appetite 3  Feeling bad or failure about yourself  3  Trouble concentrating 0  Moving slowly or fidgety/restless 3  Suicidal thoughts 0  PHQ-9 Score 18  Difficult doing  work/chores Very difficult   Review of Systems  Constitutional: Positive for unexpected weight change.  HENT: Negative.   Eyes: Negative.   Respiratory: Positive for apnea.   Cardiovascular: Negative.   Gastrointestinal: Negative.   Endocrine: Negative.   Genitourinary: Negative.   Musculoskeletal: Negative.   Skin: Negative.   Allergic/Immunologic: Negative.   Neurological: Negative.   Hematological: Negative.   Psychiatric/Behavioral: Negative.   All other systems reviewed and are negative.      Objective:   Physical Exam  Constitutional: He is oriented to person, place, and time. He appears well-developed and well-nourished.  HENT:  Head: Normocephalic and atraumatic.  Neck: Normal range of motion. Neck supple.  Cardiovascular: Normal rate and regular rhythm.  Pulmonary/Chest: Effort normal and breath sounds normal.  Musculoskeletal:  Normal Muscle Bulk and Muscle Testing Reveals: Upper Extremities: Full ROM and Muscle Strength 5/5 Lower Extremities: Full ROM and Muscle Strength 5/5 Arises from Table with Ease Gait is steady with normal steps.    Neurological: He is alert and oriented to person, place, and time.  Skin: Skin is warm and dry.  Psychiatric: He has a normal mood and affect.  Nursing note and vitals reviewed.         Assessment & Plan:  1. CVA: Continue Outpatient Therapies. He was educated on no driving until clear by Neurologist or Dr. Allena Katz. Neurology will be following.  2. PAF: Continue Eliquis:  PCP Following 3. Hypertension: Continue current medication: PCP Following.  4. Hyperlipidemia: Continue current medication regimen. PCP Following.   20 Minutes of face to face patient care time was spent during this visit. All questions was encouraged and answered.   F/U in 4-6 weeks with Dr. Allena Katz

## 2017-12-31 ENCOUNTER — Ambulatory Visit (HOSPITAL_COMMUNITY): Payer: Medicare Other | Admitting: Physical Therapy

## 2017-12-31 DIAGNOSIS — I63512 Cerebral infarction due to unspecified occlusion or stenosis of left middle cerebral artery: Secondary | ICD-10-CM

## 2017-12-31 DIAGNOSIS — R2681 Unsteadiness on feet: Secondary | ICD-10-CM

## 2017-12-31 DIAGNOSIS — R278 Other lack of coordination: Secondary | ICD-10-CM

## 2017-12-31 NOTE — Therapy (Signed)
[facility] [address] [place], [place], [address] Phone: [phone]   Fax:  [phone]  Physical Therapy Treatment  Patient Details  Name: Miguel Alvarez MRN: [id number] Date of Birth: 09/24/1950 Referring Provider: Marcello FennelAnkit Anil Patel, MD   Encounter Date: 12/31/2017  PT End of Session - 12/31/17 0906    Visit Number  2    Number of Visits  13    Date for PT Re-Evaluation  12/31/17    Authorization Type  Medicare (Secondary: [employer] Senior Supplemental)    Authorization Time Period  12/25/17 to 01/22/18    PT Start Time  0815    PT Stop Time  0855    PT Time Calculation (min)  40 min       Past Medical History:  Diagnosis Date  . Arthritis    osteoarthritis  . Atrial fibrillation (HCC)   . GERD (gastroesophageal reflux disease)     Past Surgical History:  Procedure Laterality Date  . COLONOSCOPY  pandya   6-7 years ago, some polyps  . COLONOSCOPY WITH PROPOFOL N/A 10/28/2015   Procedure: COLONOSCOPY WITH PROPOFOL;  Surgeon: Corbin Adeobert M Rourk, MD;  [facility];  Service: Endoscopy;  Laterality: N/A;  0730  . ESOPHAGOGASTRODUODENOSCOPY     Barrett's esophagus per patient  . ESOPHAGOGASTRODUODENOSCOPY (EGD) WITH PROPOFOL N/A 10/28/2015   Procedure: ESOPHAGOGASTRODUODENOSCOPY (EGD) WITH PROPOFOL;  Surgeon: Corbin Adeobert M Rourk, MD;  [facility];  Service: Endoscopy;  Laterality: N/A;  . removal of bone foot Left    age 67    There were no vitals filed for this visit.  Subjective Assessment - 12/31/17 0904    Subjective  pt accompanied by wife this session.  States he cannot get his Lt foot to do what he wants and remains swimmy headed.  No falls or issues.  States his Lt foot will roll sometimes.  Goal is to get back to weed eating and playing golf.      Currently in Pain?  No/denies                       [facility]/Exercise - 12/31/17 0001      Knee/Hip Exercises: Standing   Heel  Raises  Both;10 reps;Limitations    Heel Raises Limitations  toeraises 10 reps    Forward Lunges  Both;10 reps;Limitations    Forward Lunges Limitations  onto 4" step without UE working on stability    SLS  bilaterally max of 5: Lt: 8", Rt: 12"    Other Standing Knee Exercises  BAPS L3 Lt LE with UE assist 10 reps A/P, Rt/Lt and CW (do not do CCW)    Other Standing Knee Exercises  tandem stance 30" each             PT Education - 12/31/17 0905    Education Details  reviewed goals per initial evaluation.  reviewed HEP and education    Person(s) Educated  Patient;Spouse    Methods  Explanation;Demonstration;Tactile cues;Verbal cues;Handout    Comprehension  Verbalized understanding;Returned demonstration;Verbal cues required;Tactile cues required;Need further instruction       PT Short Term Goals - 12/25/17 1213      PT SHORT TERM GOAL #1   Title  Pt will have 1/2 grade improvement throughout proximal hip musculature in order to maximzie gait and balance.    Time  2    Period  Weeks    Status  New    Target Date  01/08/18      PT SHORT TERM GOAL #2   Title  Pt will be able to perform L SLS for 10 sec in order to demo improved balance and maximzie gait.    Time  2    Period  Weeks    Status  New      PT SHORT TERM GOAL #3   Title  Pt will be score 20/24 or > on the DGI in order to demo improved dynamic balance and maximize his community ambulation and gait on uneven ground.    Time  2    Period  Weeks    Status  New      PT SHORT TERM GOAL #4   Title  --        PT Long Term Goals - 12/25/17 1216      PT LONG TERM GOAL #1   Title  Pt will score 25/30 or > on the FGA in order to demo further improved dynamic balance and decrease risk for falls.    Time  4    Period  Weeks    Status  New    Target Date  01/22/18      PT LONG TERM GOAL #2   Title  Pt will be able to perform bil SLS for 20 sec in order to maximize gait on uneven ground and decrease risk for falls.     Time  4    Period  Weeks    Status  New      PT LONG TERM GOAL #3   Title  Pt will have 159ft improvement during without evidence of any staggering in order to demo improved gait and maximize his ability to return to his regular daily walking program.    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    Time  4    Period  Weeks    Status  New            Plan - 12/31/17 1610    Clinical Impression Statement  Reviewed evaluation including goals and HEP.  Pt had been completing tandem gait at home rather than tandem stance.  Instructed not to complete gait as he is too unstable at this time, especially in Lt lateral ankle.  Began standing BAPs with inablity to complete in CCW direction without rolling ankle.  Held on this actiity and completed CW direction only.  PT wtihotu any complaints or noted difficulty with other activities.      Rehab Potential  Excellent    PT Frequency  3x / week    PT Duration  4 weeks    PT Treatment/Interventions  ADLs/Self Care Home Management;Cryotherapy;Electrical Stimulation;Biofeedback;Canalith Repostioning;Moist Heat;Traction;Ultrasound;DME Instruction;Gait training;Stair training;Functional mobility training;Therapeutic activities;Therapeutic exercise;Balance training;Neuromuscular re-education;Patient/family education;Orthotic Fit/Training;Manual techniques;Passive range of motion;Dry needling;Energy conservation;Taping;Vestibular    PT Next Visit Plan  Progress functional strengthening and balance training.  Begin vector stance next session and side stepping with theraband.     PT Home Exercise Plan  eval: tandem stance firm surface in corner    Consulted and Agree with Plan of Care  Patient        Patient will benefit from skilled therapeutic intervention in order to improve the following deficits and impairments:  Decreased balance, Decreased mobility, Decreased strength, Difficulty walking, Decreased endurance, Decreased activity tolerance, Hypomobility, Improper body mechanics  Visit Diagnosis: Unsteadiness on feet  Left middle cerebral artery stroke [facility])  Other lack of coordination     Problem List Patient Active Problem List   Diagnosis Date Noted  . Benign essential HTN   . PAF (paroxysmal atrial fibrillation) (HCC)   . Cerebellar infarct (HCC) 12/05/2017  . Dizziness 12/02/2017  . Hyperlipidemia 12/02/2017  . Acute CVA (cerebrovascular accident) (HCC) 12/02/2017  . Atrial fibrillation, chronic (HCC) 12/02/2017  . Mucosal abnormality of esophagus   . Diverticulosis of colon without hemorrhage   . Blood in stool   . Barrett's esophagus 09/03/2015  . GERD (gastroesophageal reflux disease) 09/03/2015  . Normocytic anemia 09/03/2015  . Heme positive stool 09/03/2015   Lurena Nida, PTA/CLT [phone]  Lurena Nida 12/31/2017, 12:54 PM  [facility] [address] [place], [place], [address] Phone: [phone]   Fax:  [phone]  Name: Ottie Tillery MRN: [id number] Date of Birth: 23-Jan-1951

## 2018-01-02 ENCOUNTER — Ambulatory Visit (HOSPITAL_COMMUNITY): Payer: Medicare Other | Admitting: Physical Therapy

## 2018-01-02 ENCOUNTER — Encounter (HOSPITAL_COMMUNITY): Payer: Self-pay | Admitting: Physical Therapy

## 2018-01-02 DIAGNOSIS — R2681 Unsteadiness on feet: Secondary | ICD-10-CM

## 2018-01-02 DIAGNOSIS — I63512 Cerebral infarction due to unspecified occlusion or stenosis of left middle cerebral artery: Secondary | ICD-10-CM

## 2018-01-02 NOTE — Therapy (Signed)
[facility] [address] [place], [place], [address] Phone: [phone]   Fax:  [phone]  Physical Therapy Treatment  Patient Details  Name: Miguel Alvarez MRN: [id number] Date of Birth: 1950-09-25 Referring Provider: Marcello Fennel, MD   Encounter Date: 01/02/2018  PT End of Session - 01/02/18 0946    Visit Number  3    Number of Visits  13    Date for PT Re-Evaluation  12/31/17    Authorization Type  Medicare (Secondary: [employer] Senior Supplemental)    Authorization Time Period  12/25/17 to 01/22/18    PT Start Time  0900    PT Stop Time  0940    PT Time Calculation (min)  40 min    Equipment Utilized During Treatment  Gait belt    Activity Tolerance  Patient tolerated treatment well    Behavior During Therapy  [facility] for tasks assessed/performed       Past Medical History:  Diagnosis Date  . Arthritis    osteoarthritis  . Atrial fibrillation (HCC)   . GERD (gastroesophageal reflux disease)     Past Surgical History:  Procedure Laterality Date  . COLONOSCOPY  pandya   6-7 years ago, some polyps  . COLONOSCOPY WITH PROPOFOL N/A 10/28/2015   Procedure: COLONOSCOPY WITH PROPOFOL;  Surgeon: Corbin Ade, MD;  [facility];  Service: Endoscopy;  Laterality: N/A;  0730  . ESOPHAGOGASTRODUODENOSCOPY     Barrett's esophagus per patient  . ESOPHAGOGASTRODUODENOSCOPY (EGD) WITH PROPOFOL N/A 10/28/2015   Procedure: ESOPHAGOGASTRODUODENOSCOPY (EGD) WITH PROPOFOL;  Surgeon: Corbin Ade, MD;  [facility];  Service: Endoscopy;  Laterality: N/A;  . removal of bone foot Left    age 55    There were no vitals filed for this visit.  Subjective Assessment - 01/02/18 0902    Subjective  Patient reported that he has some foot pain that has been going on for a while. He stated his main issue following his stroke has been his balance.     Currently in Pain?  Yes    Pain Score  5     Pain Location  Other (Comment)  Foot    Pain Orientation  Left    Pain Descriptors / Indicators  Aching                       [facility] Adult PT Treatment/Exercise - 01/02/18 0001      Knee/Hip Exercises: Standing   Heel Raises  Both;10 reps;Limitations    Heel Raises Limitations  toeraises 10 reps    Forward Lunges  Both;10 reps;Limitations    Forward Lunges Limitations  onto 4" step without UE working on stability    SLS  bilaterally max of 5: Lt: 7", Rt: 12"    SLS with Vectors  Forward side and back 5'' holds x 10 each lower extremity with bilateral UE support Patient reported more difficulty when standing on the left    Other Standing Knee Exercises  BAPS L3 Lt LE with UE assist 10 reps A/P (held others as his ankle was rolling)    Other Standing Knee Exercises  Tandem stance inside parallel bars 3 x 30 seconds each lower extremity      Knee/Hip Exercises: Seated   Sit to Sand  10 reps;without UE support Onto standard height chair          Balance Exercises - 01/02/18 0940  Balance Exercises: Standing   Standing Eyes Closed  Narrow base of support (BOS);Solid surface;Limitations;Other (comment);30 secs 1 rep NBOS, 1 rep tandem each leg    Tandem Stance  Eyes open;Limitations;Other (comment) Shoulder flexion overhead x 10 with 2# bar weight    Tandem Gait  Forward;Other (comment) Therapist minimal assistance 3 roundtrips 14 feet    Sidestepping  3 reps;Theraband;Other (comment) In parallel bars 3 round trips with red tehraband 8 feet    Heel Raises Limitations  Heel walking 14 feet x 2    Toe Raise Limitations  Toe walking 14 feet x 2          PT Short Term Goals - 01/02/18 0948      PT SHORT TERM GOAL #1   Title  Pt will have 1/2 grade improvement throughout proximal hip musculature in order to maximzie gait and balance.    Time  2    Period  Weeks    Status  On-going      PT SHORT TERM GOAL #2   Title  Pt will be able to perform L SLS for 10 sec in order to demo improved  balance and maximzie gait.    Time  2    Period  Weeks    Status  On-going      PT SHORT TERM GOAL #3   Title  Pt will be score 20/24 or > on the DGI in order to demo improved dynamic balance and maximize his community ambulation and gait on uneven ground.    Time  2    Period  Weeks    Status  On-going        PT Long Term Goals - 01/02/18 0948      PT LONG TERM GOAL #1   Title  Pt will score 25/30 or > on the FGA in order to demo further improved dynamic balance and decrease risk for falls.    Time  4    Period  Weeks    Status  On-going      PT LONG TERM GOAL #2   Title  Pt will be able to perform bil SLS for 20 sec in order to maximize gait on uneven ground and decrease risk for falls.    Time  4    Period  Weeks    Status  On-going      PT LONG TERM GOAL #3   Title  Pt will have 116ft improvement during without evidence of any staggering in order to demo improved gait and maximize his ability to return to his regular daily walking program.    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    Time  4    Period  Weeks    Status  On-going            Plan - 01/02/18 0947    Clinical Impression Statement  Patient reported that his balance was what he has the most difficulty with now. Therefore, this session focused on balance. This session added tandem stance with overhead shoulder flexion, sidestepping with red theraband, heel walking, toe walking, balance with eyes closed, and sit  to stands. Patient demonstrated increased ankle rolling when lateral BAPS board was attempted and therefore did not perform this or ankle rotation on BAPS. Patient tolerated all other exercises well and only required 1 therapeutic rest break  for a couple minutes throughout. Patient would benefit from continued skilled physical therapy to continue progressing patient towards functional goals.     Rehab Potential  Excellent    PT Frequency  3x / week    PT Duration  4 weeks    PT Treatment/Interventions  ADLs/Self Care Home Management;Cryotherapy;Electrical Stimulation;Biofeedback;Canalith Repostioning;Moist Heat;Traction;Ultrasound;DME Instruction;Gait training;Stair training;Functional mobility training;Therapeutic activities;Therapeutic exercise;Balance training;Neuromuscular re-education;Patient/family education;Orthotic Fit/Training;Manual techniques;Passive range of motion;Dry needling;Energy conservation;Taping;Vestibular    PT Next Visit Plan  Progress functional strengthening and balance training.  Continue to focus on balance activities. Add stepping over hurdles next session.     PT Home Exercise Plan  eval: tandem stance firm surface in corner    Consulted and Agree with Plan of Care  Patient       Patient will benefit from skilled therapeutic intervention in order to improve the following deficits and impairments:  Decreased balance, Decreased mobility, Decreased strength, Difficulty walking, Decreased endurance, Decreased activity tolerance, Hypomobility, Improper body mechanics  Visit Diagnosis: Unsteadiness on feet  Left middle cerebral artery stroke [facility](HCC)     Problem List Patient Active Problem List   Diagnosis Date Noted  . Benign essential HTN   . PAF (paroxysmal atrial fibrillation) (HCC)   . Cerebellar infarct (HCC) 12/05/2017  . Dizziness 12/02/2017  . Hyperlipidemia 12/02/2017  . Acute CVA (cerebrovascular accident) (HCC) 12/02/2017  . Atrial fibrillation, chronic (HCC) 12/02/2017  . Mucosal abnormality of esophagus   . Diverticulosis of colon without hemorrhage   . Blood in stool   . Barrett's esophagus 09/03/2015  . GERD (gastroesophageal reflux disease) 09/03/2015  . Normocytic anemia  09/03/2015  . Heme positive stool 09/03/2015   Verne CarrowMacy Tien Aispuro PT, DPT 9:50 AM, 01/02/18 [phone]  [facility] [address] [place], [place], [address] Phone: [phone]   Fax:  [phone]  Name: Miguel Alvarez MRN: [id number] Date of Birth: 06/06/1951

## 2018-01-04 ENCOUNTER — Encounter (HOSPITAL_COMMUNITY): Payer: Self-pay

## 2018-01-04 ENCOUNTER — Ambulatory Visit (HOSPITAL_COMMUNITY): Payer: Medicare Other

## 2018-01-04 DIAGNOSIS — R278 Other lack of coordination: Secondary | ICD-10-CM

## 2018-01-04 DIAGNOSIS — I63512 Cerebral infarction due to unspecified occlusion or stenosis of left middle cerebral artery: Secondary | ICD-10-CM

## 2018-01-04 DIAGNOSIS — R2681 Unsteadiness on feet: Secondary | ICD-10-CM

## 2018-01-04 NOTE — Therapy (Signed)
[facility] [address] [place], [place], [address] Phone: [phone]   Fax:  [phone]  Physical Therapy Treatment  Patient Details  Name: Miguel Alvarez MRN: [id number] Date of Birth: 08/12/1950 Referring Provider: Marcello FennelAnkit Anil Patel, MD   Encounter Date: 01/04/2018  PT End of Session - 01/04/18 0916    Visit Number  4    Number of Visits  13    Date for PT Re-Evaluation  01/22/18 Minireassess 01/09/18    Authorization Type  Medicare (Secondary: Dispensing opticianAetna Senior Supplemental)    Authorization Time Period  12/25/17 to 01/22/18    Authorization - Visit Number  4    Authorization - Number of Visits  10    PT Start Time  0903    PT Stop Time  0946    PT Time Calculation (min)  43 min    Equipment Utilized During Treatment  Gait belt    Activity Tolerance  Patient tolerated treatment well    Behavior During Therapy  [id number] for tasks assessed/performed       Past Medical History:  Diagnosis Date  . Arthritis    osteoarthritis  . Atrial fibrillation (HCC)   . GERD (gastroesophageal reflux disease)     Past Surgical History:  Procedure Laterality Date  . COLONOSCOPY  pandya   6-7 years ago, some polyps  . COLONOSCOPY WITH PROPOFOL N/A 10/28/2015   Procedure: COLONOSCOPY WITH PROPOFOL;  Surgeon: Corbin Adeobert M Rourk, MD;  [facility];  Service: Endoscopy;  Laterality: N/A;  0730  . ESOPHAGOGASTRODUODENOSCOPY     Barrett's esophagus per patient  . ESOPHAGOGASTRODUODENOSCOPY (EGD) WITH PROPOFOL N/A 10/28/2015   Procedure: ESOPHAGOGASTRODUODENOSCOPY (EGD) WITH PROPOFOL;  Surgeon: Corbin Adeobert M Rourk, MD;  [facility];  Service: Endoscopy;  Laterality: N/A;  . removal of bone foot Left    age 67    There were no vitals filed for this visit.  Subjective Assessment - 01/04/18 0906    Subjective  Pt stated he continues to have annoying pain Lt foot, unable to give number today.  Reports main difficulty wiht balalnce    Patient  Stated Goals  get back where I was at if possible    Currently in Pain?  Yes    Pain Score  -- unable to give number today, reports annoying pain.    Pain Location  Ankle    Pain Orientation  Left    Pain Descriptors / Indicators  Discomfort    Pain Type  Acute pain    Pain Onset  More than a month ago    Pain Frequency  Constant                       [facility] Adult PT Treatment/Exercise - 01/04/18 0001      Knee/Hip Exercises: Standing   Heel Raises  15 reps    Heel Raises Limitations  toeraises 15 reps    Forward Lunges  Both;15 reps    Functional Squat  15 reps front of mat and cueing for mechanics    SLS  bilaterally max of 5: Lt: 7", Rt: 12"    SLS with Vectors  Forward side and back 5'' holds x 10 each lower extremity with bilateral UE support          Balance Exercises - 01/04/18 0934      Balance Exercises: Standing   Tandem Stance  Eyes open;Foam/compliant surface;3 reps;30 secs    SLS  Eyes open;Solid surface;5 reps Lt 15, Rt 9" max    SLS with Vectors  Solid surface;Intermittent upper extremity assist;5 reps 5" 1 HHA    Tandem Gait  Forward;Retro;2 reps    Sidestepping  3 reps;Theraband;Other (comment) RTB no HHA          PT Short Term Goals - 01/02/18 0948      PT SHORT TERM GOAL #1   Title  Pt will have 1/2 grade improvement throughout proximal hip musculature in order to maximzie gait and balance.    Time  2    Period  Weeks    Status  On-going      PT SHORT TERM GOAL #2   Title  Pt will be able to perform L SLS for 10 sec in order to demo improved balance and maximzie gait.    Time  2    Period  Weeks    Status  On-going      PT SHORT TERM GOAL #3   Title  Pt will be score 20/24 or > on the DGI in order to demo improved dynamic balance and maximize his community ambulation and gait on uneven ground.    Time  2    Period  Weeks    Status  On-going        PT Long Term Goals - 01/02/18 0948      PT LONG TERM GOAL #1   Title   Pt will score 25/30 or > on the FGA in order to demo further improved dynamic balance and decrease risk for falls.    Time  4    Period  Weeks    Status  On-going      PT LONG TERM GOAL #2   Title  Pt will be able to perform bil SLS for 20 sec in order to maximize gait on uneven ground and decrease risk for falls.    Time  4    Period  Weeks    Status  On-going      PT LONG TERM GOAL #3   Title  Pt will have 172ft improvement during without evidence of any staggering in order to demo improved gait and maximize his ability to return to his regular daily walking program.    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    Time  4    Period  Weeks    Status  On-going            Plan - 01/04/18 1221    Clinical Impression Statement  Continued session focus with balance training and LE strengthening.  Added dynamic surface with tandem stance for stability and forwards and lateral 6 & 12in hurdles with 3-5" holds to improve SLS and gluteal strengthening with min A for LOB episodes.  No reports of increased pain through session, was limited by fatigue.  Discussed swimming with pt and wife, encouraged to complete with supervision; wife reports fear with steps getting in (handrail is available.)    Rehab Potential  Excellent    PT Frequency  3x / week    PT Duration  4 weeks  PT Treatment/Interventions  ADLs/Self Care Home Management;Cryotherapy;Electrical Stimulation;Biofeedback;Canalith Repostioning;Moist Heat;Traction;Ultrasound;DME Instruction;Gait training;Stair training;Functional mobility training;Therapeutic activities;Therapeutic exercise;Balance training;Neuromuscular re-education;Patient/family education;Orthotic  Fit/Training;Manual techniques;Passive range of motion;Dry needling;Energy conservation;Taping;Vestibular    PT Next Visit Plan  Progress functional strengthening and balance training.  Continue to focus on balance activities.  Assess stair mechancis and begin step up trianing for strengthening.      PT Home Exercise Plan  eval: tandem stance firm surface in corner       Patient will benefit from skilled therapeutic intervention in order to improve the following deficits and impairments:  Decreased balance, Decreased mobility, Decreased strength, Difficulty walking, Decreased endurance, Decreased activity tolerance, Hypomobility, Improper body mechanics  Visit Diagnosis: Unsteadiness on feet  Left middle cerebral artery stroke [facility])  Other lack of coordination     Problem List Patient Active Problem List   Diagnosis Date Noted  . Benign essential HTN   . PAF (paroxysmal atrial fibrillation) (HCC)   . Cerebellar infarct (HCC) 12/05/2017  . Dizziness 12/02/2017  . Hyperlipidemia 12/02/2017  . Acute CVA (cerebrovascular accident) (HCC) 12/02/2017  . Atrial fibrillation, chronic (HCC) 12/02/2017  . Mucosal abnormality of esophagus   . Diverticulosis of colon without hemorrhage   . Blood in stool   . Barrett's esophagus 09/03/2015  . GERD (gastroesophageal reflux disease) 09/03/2015  . Normocytic anemia 09/03/2015  . Heme positive stool 09/03/2015   Becky Sax, LPTA; CBIS [phone]  Juel Burrow 01/04/2018, 12:53 PM  [facility] [address] [place], [place], [address] Phone: [phone]   Fax:  [phone]  Name: Ferron Ishmael MRN: [id number] Date of Birth: 1951/05/15

## 2018-01-07 ENCOUNTER — Encounter (HOSPITAL_COMMUNITY): Payer: Self-pay

## 2018-01-07 ENCOUNTER — Ambulatory Visit (HOSPITAL_COMMUNITY): Payer: Medicare Other

## 2018-01-07 DIAGNOSIS — I63512 Cerebral infarction due to unspecified occlusion or stenosis of left middle cerebral artery: Secondary | ICD-10-CM | POA: Diagnosis not present

## 2018-01-07 DIAGNOSIS — R2681 Unsteadiness on feet: Secondary | ICD-10-CM

## 2018-01-07 DIAGNOSIS — R278 Other lack of coordination: Secondary | ICD-10-CM

## 2018-01-07 NOTE — Therapy (Signed)
[facility] [address] [place], [place], [address] Phone: [phone]   Fax:  [phone]  Physical Therapy Treatment  Patient Details  Name: Dave Mergen MRN: [id number] Date of Birth: December 04, 1950 Referring Provider: Marcello Fennel, MD   Encounter Date: 01/07/2018  PT End of Session - 01/07/18 1519    Visit Number  5    Number of Visits  13    Date for PT Re-Evaluation  01/22/18 Minireassess 01/09/18    Authorization Type  Medicare (Secondary: Dispensing optician)    Authorization Time Period  12/25/17 to 01/22/18    Authorization - Visit Number  5    Authorization - Number of Visits  10    PT Start Time  1519    PT Stop Time  1559    PT Time Calculation (min)  40 min    Equipment Utilized During Treatment  Gait belt    Activity Tolerance  Patient tolerated treatment well    Behavior During Therapy  [facility] for tasks assessed/performed       Past Medical History:  Diagnosis Date  . Arthritis    osteoarthritis  . Atrial fibrillation (HCC)   . GERD (gastroesophageal reflux disease)     Past Surgical History:  Procedure Laterality Date  . COLONOSCOPY  pandya   6-7 years ago, some polyps  . COLONOSCOPY WITH PROPOFOL N/A 10/28/2015   Procedure: COLONOSCOPY WITH PROPOFOL;  Surgeon: Corbin Ade, MD;  [facility];  Service: Endoscopy;  Laterality: N/A;  0730  . ESOPHAGOGASTRODUODENOSCOPY     Barrett's esophagus per patient  . ESOPHAGOGASTRODUODENOSCOPY (EGD) WITH PROPOFOL N/A 10/28/2015   Procedure: ESOPHAGOGASTRODUODENOSCOPY (EGD) WITH PROPOFOL;  Surgeon: Corbin Ade, MD;  [facility];  Service: Endoscopy;  Laterality: N/A;  . removal of bone foot Left    age 67    There were no vitals filed for this visit.  Subjective Assessment - 01/07/18 1519    Subjective  Pt reports his foot is about the same.     Patient Stated Goals  get back where I was at if possible    Currently in Pain?  Yes     Pain Score  -- no #, just annoying    Pain Location  Ankle    Pain Orientation  Left    Pain Descriptors / Indicators  Discomfort    Pain Type  Acute pain    Pain Onset  More than a month ago    Pain Frequency  Constant              [facility] Adult PT Treatment/Exercise - 01/07/18 0001      Knee/Hip Exercises: Standing   Heel Raises  Both;20 reps    Heel Raises Limitations  heel and toe on incline    Forward Lunges  Both;15 reps    Forward Lunges Limitations  onto bosu dome up    Lateral Step Up  Both;10 reps;Step Height: 6"    Forward Step Up  Both;10 reps;Step Height: 6"    Functional Squat  15 reps    Functional Squat Limitations  to mat for proper mechanics    Gait Training  sidestepping BTB 73ft x2RT      Balance Exercises - 01/07/18 1532      Balance Exercises: Standing   Standing Eyes Opened  Narrow base of support (BOS);Foam/compliant surface tandem stance foam +up/down, R/L head turns x10 reps each    Tandem Stance  Eyes closed;Foam/compliant surface;Intermittent upper extremity support;5 reps;10 secs    SLS  Eyes open;Foam/compliant surface;Intermittent upper extremity support;5 reps;10 secs BLE    Standing, One Foot on a Step  Eyes open;Foam/compliant surface front foot on dyna disc +palov press with GTB x10 each    Tandem Gait  Forward;Retro;3 reps    Cone Rotation Limitations  cone taps on foam 2x5RT BLE              PT Education - 01/07/18 1521    Education Details  exercise technique, continue and updated HEP    Person(s) Educated  Patient    Methods  Explanation;Demonstration;Handout    Comprehension  Verbalized understanding;Returned demonstration       PT Short Term Goals - 01/02/18 0948      PT SHORT TERM GOAL #1   Title  Pt will have 1/2 grade improvement throughout proximal hip musculature in order to maximzie gait and balance.    Time  2    Period  Weeks    Status  On-going      PT SHORT TERM GOAL #2   Title  Pt will be able to perform  L SLS for 10 sec in order to demo improved balance and maximzie gait.    Time  2    Period  Weeks    Status  On-going      PT SHORT TERM GOAL #3   Title  Pt will be score 20/24 or > on the DGI in order to demo improved dynamic balance and maximize his community ambulation and gait on uneven ground.    Time  2    Period  Weeks    Status  On-going        PT Long Term Goals - 01/02/18 0948      PT LONG TERM GOAL #1   Title  Pt will score 25/30 or > on the FGA in order to demo further improved dynamic balance and decrease risk for falls.    Time  4    Period  Weeks    Status  On-going      PT LONG TERM GOAL #2   Title  Pt will be able to perform bil SLS for 20 sec in order to maximize gait on uneven ground and decrease risk for falls.    Time  4    Period  Weeks    Status  On-going      PT LONG TERM GOAL #3   Title  Pt will have 162ft improvement during without evidence of any staggering in order to demo improved gait and maximize his ability to return to his regular daily walking program.    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    Time  4    Period  Weeks    Status  On-going            Plan - 01/07/18 1601    Clinical Impression Statement  Continued with established POC focusing on functional strength and balance. Pt continues to be challenged with NBOS and dynamic movements during balance activities. Added cone taps on foam and palov press  in staggered stance with front foot elevated on dyna disc; pt generally more unsteady when stabilizing on LLE. Updated HEP this date to include heel and toe raises and sidestepping with BTB.    Rehab Potential  Excellent    PT Frequency  3x / week    PT Duration  4 weeks     PT Treatment/Interventions  ADLs/Self Care Home Management;Cryotherapy;Electrical Stimulation;Biofeedback;Canalith Repostioning;Moist Heat;Traction;Ultrasound;DME Instruction;Gait training;Stair training;Functional mobility training;Therapeutic activities;Therapeutic exercise;Balance training;Neuromuscular re-education;Patient/family education;Orthotic Fit/Training;Manual techniques;Passive range of motion;Dry needling;Energy conservation;Taping;Vestibular    PT Next Visit Plan  Progress functional strengthening and balance training.  Continue to focus on balance activities. continue stair training      PT Home Exercise Plan  eval: tandem stance firm surface in corner; 7/22: heel and toe raises, sidestepping BTB, continue to practice SLS    Consulted and Agree with Plan of Care  Patient       Patient will benefit from skilled therapeutic intervention in order to improve the following deficits and impairments:  Decreased balance, Decreased mobility, Decreased strength, Difficulty walking, Decreased endurance, Decreased activity tolerance, Hypomobility, Improper body mechanics  Visit Diagnosis: Unsteadiness on feet  Left middle cerebral artery stroke [facility](HCC)  Other lack of coordination     Problem List Patient Active Problem List   Diagnosis Date Noted  . Benign essential HTN   . PAF (paroxysmal atrial fibrillation) (HCC)   . Cerebellar infarct (HCC) 12/05/2017  . Dizziness 12/02/2017  . Hyperlipidemia 12/02/2017  . Acute CVA (cerebrovascular accident) (HCC) 12/02/2017  . Atrial fibrillation, chronic (HCC) 12/02/2017  . Mucosal abnormality of esophagus   . Diverticulosis of colon without hemorrhage   . Blood in stool   . Barrett's esophagus 09/03/2015  . GERD (gastroesophageal reflux disease) 09/03/2015  . Normocytic anemia 09/03/2015  . Heme positive stool 09/03/2015       Jac CanavanBrooke Vale Peraza PT, DPT  [facility] [address]  [place], [place], [address] Phone: [phone]   Fax:  [phone]  Name: Brita Romphomas Kleinman MRN: [id number] Date of Birth: 10/22/1950

## 2018-01-09 ENCOUNTER — Encounter (HOSPITAL_COMMUNITY): Payer: Self-pay

## 2018-01-09 ENCOUNTER — Ambulatory Visit (HOSPITAL_COMMUNITY): Payer: Medicare Other

## 2018-01-09 DIAGNOSIS — I63512 Cerebral infarction due to unspecified occlusion or stenosis of left middle cerebral artery: Secondary | ICD-10-CM

## 2018-01-09 DIAGNOSIS — R278 Other lack of coordination: Secondary | ICD-10-CM

## 2018-01-09 DIAGNOSIS — R2681 Unsteadiness on feet: Secondary | ICD-10-CM

## 2018-01-09 NOTE — Therapy (Addendum)
[place], [place], [address] Phone: [phone]   Fax:  [phone]    I have read, reviewed, and personally communicated with the PTA and patient and agree with the decision to discharge patient today due to progress made and pt being pleased with his current functional level.   Geraldine Solar PT, DPT  PHYSICAL THERAPY DISCHARGE SUMMARY  Visits from Start of Care: 6  Current functional level related to goals / functional outcomes: See below   Remaining deficits: See below   Education / Equipment: Continue HEP  Plan: Patient agrees to discharge.  Patient goals were partially met. Patient is being discharged due to being pleased with the current functional level.  ?????       Physical Therapy Treatment  Patient Details  Name: Miguel Alvarez MRN: [id number] Date of Birth: 03-13-1951 Referring Provider: Dr. Delice Lesch    Encounter Date: 01/09/2018  PT End of Session - 01/09/18 1345    Visit Number  6    Number of Visits  13    Date for PT Re-Evaluation  01/22/18 Minireassess complete 01/09/18    Authorization Type  Medicare (Secondary: Estate agent)    Authorization Time Period  12/25/17 to 01/22/18    Authorization - Visit Number  6    Authorization - Number of Visits  10    PT Start Time  3903    PT Stop Time  1345    PT Time Calculation (min)  42 min    Equipment Utilized During Treatment  Gait belt    Activity Tolerance  Patient tolerated treatment well    Behavior During Therapy  WFL for tasks assessed/performed       Past Medical History:  Diagnosis Date  . Arthritis    osteoarthritis  . Atrial fibrillation ([place])   . GERD (gastroesophageal reflux disease)     Past Surgical History:  Procedure Laterality Date  . COLONOSCOPY  pandya   6-7 years ago, some polyps  . COLONOSCOPY WITH PROPOFOL N/A 10/28/2015   Procedure: COLONOSCOPY WITH PROPOFOL;  Surgeon: Daneil Dolin, MD;   [facility];  Service: Endoscopy;  Laterality: N/A;  0730  . ESOPHAGOGASTRODUODENOSCOPY     Barrett's esophagus per patient  . ESOPHAGOGASTRODUODENOSCOPY (EGD) WITH PROPOFOL N/A 10/28/2015   Procedure: ESOPHAGOGASTRODUODENOSCOPY (EGD) WITH PROPOFOL;  Surgeon: Daneil Dolin, MD;  [facility];  Service: Endoscopy;  Laterality: N/A;  . removal of bone foot Left    age 26    There were no vitals filed for this visit.  Subjective Assessment - 01/09/18 1317    Subjective  Pt stated his foot continues to bother him on occassion.  Reports he has increased his walking [place] 4 laps    How long can you sit comfortably?  no issues    How long can you stand comfortably?  Able to stand for 5 minutes due to foot pain vs fatigue (was 1 min_    How long can you walk comfortably?  20 minutes (was 5-10 mins)    Patient Stated Goals  get back where I was at if possible    Currently in Pain?  Yes    Pain Score  5     Pain Location  Foot    Pain Orientation  Left    Pain Descriptors / Indicators  Nagging    Pain Type  Acute pain    Pain Onset  More than a month ago    Pain Frequency  Constant    Aggravating Factors   standing, walking    Pain Relieving Factors  rest, seated         [facility] PT Assessment - 01/09/18 0001      Assessment   Medical Diagnosis  L Cerebellar CVA    Referring Provider  Dr. Delice Lesch     Onset Date/Surgical Date  12/02/17    Hand Dominance  Right    Next MD Visit  01/25/18    Prior Therapy  CIR      Observation/Other Assessments   Focus on Therapeutic Outcomes (FOTO)   67%  was 50% initially      Functional Tests   Functional tests  Sit to Stand      Sit to Stand   Comments  30 sec chair rise test: 14 was 30 sec chair rise test: 13      Strength   Right Hip Flexion  5/5    Right Hip Extension  5/5 was 4+    Right Hip ABduction  5/5 was 4+    Left Hip Flexion  5/5    Left Hip Extension  5/5 was 4/5    Left Hip ABduction   5/5 was 4+    Right Knee Flexion  5/5    Right Knee Extension  5/5    Left Knee Flexion  5/5    Left Knee Extension  5/5    Right Ankle Dorsiflexion  5/5    Left Ankle Dorsiflexion  5/5      Ambulation/Gait   Assistive device  None    Gait Pattern  Step-through pattern      Static Standing Balance   Static Standing - Balance Support  No upper extremity supported    Static Standing Balance -  Activities   Single Leg Stance - Right Leg;Single Leg Stance - Left Leg    Static Standing - Comment/# of Minutes  Rt 6, 14, 22= average 14";  Lt 11, 21, 26"= average 19.3 was Rt 10", Lt 2.5"      Standardized Balance Assessment   Standardized Balance Assessment  Dynamic Gait Index      Dynamic Gait Index   Level Surface  Normal was 3    Change in Gait Speed  Normal was 2    Gait with Horizontal Head Turns  Normal was 1    Gait with Vertical Head Turns  Normal was 2    Gait and Pivot Turn  Mild Impairment was 3    Step Over Obstacle  Normal was 3    Step Around Obstacles  Mild Impairment was 2    Steps  Normal was 2    Total Score  22      Functional Gait  Assessment   Gait assessed   Yes    Gait Level Surface  Walks 20 ft in less than 5.5 sec, no assistive devices, good speed, no evidence for imbalance, normal gait pattern, deviates no more than 6 in outside of the 12 in walkway width.    Change in Gait Speed  Able to smoothly change walking speed without loss of balance or gait deviation. Deviate no more than 6 in outside of the 12 in walkway width.    Gait with Horizontal Head Turns  Performs head turns smoothly with no change in gait. Deviates no more than 6 in outside 12 in walkway width    Gait with Vertical  Head Turns  Performs head turns with no change in gait. Deviates no more than 6 in outside 12 in walkway width.    Gait and Pivot Turn  Pivot turns safely within 3 sec and stops quickly with no loss of balance.    Step Over Obstacle  Is able to step over 2 stacked shoe boxes taped  together (9 in total height) without changing gait speed. No evidence of imbalance.    Gait with Narrow Base of Support  Ambulates 7-9 steps.    Gait with Eyes Closed  Walks 20 ft, no assistive devices, good speed, no evidence of imbalance, normal gait pattern, deviates no more than 6 in outside 12 in walkway width. Ambulates 20 ft in less than 7 sec.    Ambulating Backwards  Walks 20 ft, no assistive devices, good speed, no evidence for imbalance, normal gait    Steps  Alternating feet, no rail.    Total Score  29                   [facility] Adult PT Treatment/Exercise - 01/09/18 0001      Ambulation/Gait   Ambulation Distance (Feet)  772 Feet was 712      Berg Balance Test   Sit to Stand  Able to stand without using hands and stabilize independently    Standing Unsupported  Able to stand safely 2 minutes    Sitting with Back Unsupported but Feet Supported on Floor or Stool  Able to sit safely and securely 2 minutes    Stand to Sit  Sits safely with minimal use of hands    Transfers  Able to transfer safely, minor use of hands    Standing Unsupported with Eyes Closed  Able to stand 10 seconds safely    Standing Ubsupported with Feet Together  Able to place feet together independently and stand 1 minute safely    From Standing, Reach Forward with Outstretched Arm  Can reach confidently >25 cm (10")    From Standing Position, Pick up Object from Floor  Able to pick up shoe safely and easily    From Standing Position, Turn to Look Behind Over each Shoulder  Looks behind from both sides and weight shifts well    Turn 360 Degrees  Able to turn 360 degrees safely one side only in 4 seconds or less    Standing Unsupported, Alternately Place Feet on Step/Stool  Able to stand independently and safely and complete 8 steps in 20 seconds    Standing Unsupported, One Foot in Front  Able to place foot tandem independently and hold 30 seconds    Standing on One Leg  Able to lift leg  independently and hold 5-10 seconds    Total Score  54          Balance Exercises - 01/09/18 1706      Balance Exercises: Standing   SLS  3 reps 01/09/18: Rt 6, 14, 22= average 14";  Lt 11, 21, 26"= average    Tandem Gait  Forward;Retro;3 reps    Sidestepping  3 reps;Theraband;Other (comment)    Step Over Hurdles / Cones  DGI acitviites x 2    Sit to Stand Time  14STS in 30"    Other Standing Exercises  gait with eyes closed           PT Short Term Goals - 01/09/18 1333      PT SHORT TERM GOAL #1   Title  Pt will have 1/2 grade improvement throughout proximal hip musculature in order to maximzie gait and balance.    Baseline  7/24: MMT 5/5     Status  Achieved      PT SHORT TERM GOAL #2   Title  Pt will be able to perform L SLS for 10 sec in order to demo improved balance and maximzie gait.    Baseline  01/09/18: Rt 6, 14, 22= average 14";  Lt 11, 21, 26"= average 19.3    Status  Achieved      PT SHORT TERM GOAL #3   Title  Pt will be score 20/24 or > on the DGI in order to demo improved dynamic balance and maximize his community ambulation and gait on uneven ground.    Baseline  01/09/18: DGI 22/24 was 18/24    Status  Achieved        PT Long Term Goals - 01/09/18 1405      PT LONG TERM GOAL #1   Title  Pt will score 25/30 or > on the FGA in order to demo further improved dynamic balance and decrease risk for falls.    Baseline  01/09/18: FGA 29/30    Status  Achieved      PT LONG TERM GOAL #2   Title  Pt will be able to perform bil SLS for 20 sec in order to maximize gait on uneven ground and decrease risk for falls.    Baseline  01/09/18: Rt 6, 14, 22= average 14";  Lt 11, 21, 26"= average 19.3    Status  On-going      PT LONG TERM GOAL #3   Title  Pt will have 171f improvement during 3MWT without evidence of any staggering in order to demo improved gait and maximize his ability to return to his regular daily walking program.    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    Baseline  01/09/18:  Reports ability to perform all ADLs with no reports of LOB    Status  Achieved            Plan - 01/09/18 1545    Clinical Impression Statement  Reviewed goals this session with the following findings:  Pt reports compliance with HEP as well as increasing his daily walking to 4RT to church parking lot near house.  Pt stated he is limited by Lt foot pain vs fatigue to increase gait tolerance.  Pt presents with improved strength 5/5 all LE musculature.  Improved balance noted with SLS, DGI, FGA and BERG with all higher scores.  Pt reports he has been compliant with balance activities daily.  Reviewed goals acheived as well as those partially met with pt and discussed continuing OPPT vs DC, pt stated he would feel comfortable working towards remaining goals at home.  Reviewed exercises completeing at home and encouraged work on balance daily.      Rehab Potential  Excellent    PT Frequency  3x / week    PT Duration  4 weeks    PT Treatment/Interventions  ADLs/Self Care Home Management;Cryotherapy;Electrical Stimulation;Biofeedback;Canalith  Repostioning;Moist Heat;Traction;Ultrasound;DME Instruction;Gait training;Stair training;Functional mobility training;Therapeutic activities;Therapeutic exercise;Balance training;Neuromuscular re-education;Patient/family education;Orthotic Fit/Training;Manual techniques;Passive range of motion;Dry needling;Energy conservation;Taping;Vestibular    PT Next Visit Plan  DC to HEP per all goals met/partially met.      PT Home Exercise Plan  eval: tandem stance firm surface in corner; 7/22: heel and toe raises, sidestepping BTB,  continue to practice SLS       Patient will benefit from skilled therapeutic intervention in order to improve the following deficits and impairments:  Decreased balance, Decreased mobility, Decreased strength, Difficulty walking, Decreased endurance, Decreased activity tolerance, Hypomobility, Improper body mechanics  Visit Diagnosis: Unsteadiness on feet  Left middle cerebral artery stroke [facility])  Other lack of coordination     Problem List Patient Active Problem List   Diagnosis Date Noted  . Benign essential HTN   . PAF (paroxysmal atrial fibrillation) ([place])   . Cerebellar infarct ([place]) 12/05/2017  . Dizziness 12/02/2017  . Hyperlipidemia 12/02/2017  . Acute CVA (cerebrovascular accident) ([place]) 12/02/2017  . Atrial fibrillation, chronic ([place]) 12/02/2017  . Mucosal abnormality of esophagus   . Diverticulosis of colon without hemorrhage   . Blood in stool   . Barrett's esophagus 09/03/2015  . GERD (gastroesophageal reflux disease) 09/03/2015  . Normocytic anemia 09/03/2015  . Heme positive stool 09/03/2015   Ihor Austin, LPTA; CBIS [phone]  Aldona Lento 01/09/2018, 5:09 PM  [place], [place], [address] Phone: [phone]   Fax:  [phone]  Name: Miguel Alvarez MRN: [id number] Date of Birth: Apr 19, 1951

## 2018-01-11 ENCOUNTER — Encounter (HOSPITAL_COMMUNITY): Payer: Medicare Other

## 2018-01-14 ENCOUNTER — Encounter (HOSPITAL_COMMUNITY): Payer: Medicare Other

## 2018-01-16 ENCOUNTER — Encounter (HOSPITAL_COMMUNITY): Payer: Medicare Other

## 2018-01-18 ENCOUNTER — Encounter (HOSPITAL_COMMUNITY): Payer: Medicare Other

## 2018-01-21 ENCOUNTER — Encounter (HOSPITAL_COMMUNITY): Payer: Medicare Other | Admitting: Physical Therapy

## 2018-01-23 ENCOUNTER — Encounter (HOSPITAL_COMMUNITY): Payer: Medicare Other | Admitting: Physical Therapy

## 2018-01-25 ENCOUNTER — Encounter: Payer: Self-pay | Admitting: Physical Medicine & Rehabilitation

## 2018-01-25 ENCOUNTER — Encounter: Payer: Medicare Other | Attending: Physical Medicine & Rehabilitation | Admitting: Physical Medicine & Rehabilitation

## 2018-01-25 VITALS — BP 128/71 | HR 52 | Ht 74.0 in | Wt 282.0 lb

## 2018-01-25 DIAGNOSIS — I480 Paroxysmal atrial fibrillation: Secondary | ICD-10-CM | POA: Diagnosis not present

## 2018-01-25 DIAGNOSIS — Z9889 Other specified postprocedural states: Secondary | ICD-10-CM | POA: Diagnosis not present

## 2018-01-25 DIAGNOSIS — M545 Low back pain: Secondary | ICD-10-CM

## 2018-01-25 DIAGNOSIS — I639 Cerebral infarction, unspecified: Secondary | ICD-10-CM | POA: Diagnosis not present

## 2018-01-25 DIAGNOSIS — I10 Essential (primary) hypertension: Secondary | ICD-10-CM | POA: Insufficient documentation

## 2018-01-25 DIAGNOSIS — M1990 Unspecified osteoarthritis, unspecified site: Secondary | ICD-10-CM | POA: Insufficient documentation

## 2018-01-25 DIAGNOSIS — Z09 Encounter for follow-up examination after completed treatment for conditions other than malignant neoplasm: Secondary | ICD-10-CM | POA: Diagnosis present

## 2018-01-25 DIAGNOSIS — G8929 Other chronic pain: Secondary | ICD-10-CM

## 2018-01-25 DIAGNOSIS — Z7901 Long term (current) use of anticoagulants: Secondary | ICD-10-CM | POA: Insufficient documentation

## 2018-01-25 DIAGNOSIS — Z87891 Personal history of nicotine dependence: Secondary | ICD-10-CM | POA: Diagnosis not present

## 2018-01-25 DIAGNOSIS — R29818 Other symptoms and signs involving the nervous system: Secondary | ICD-10-CM | POA: Diagnosis not present

## 2018-01-25 DIAGNOSIS — E785 Hyperlipidemia, unspecified: Secondary | ICD-10-CM | POA: Diagnosis not present

## 2018-01-25 NOTE — Progress Notes (Signed)
Subjective:    Patient ID: [facility], male    DOB: 12/28/1950, 67 y.o.   MRN: [id number]  HPI 67 year old right-handed male with history of hypertension, hyperlipidemia, atrial fibrillation, maintained on Eliquis, remote tobacco abuse, presents for follow up for left middle cerebellar peduncle infarct.   DATE OF DISCHARGE:  12/12/2017 DATE OF ADMISSION:  12/04/2017  Last seen in clinic on 12/25/17 by NP. Notes reviewed.  Since that time, he states he continues to have balance, hearing, sensory deficits in face.  He has started ambulating more without falls. He completed therapies, continues HEP.  He is scheduled to see Neurology in [facility].  He saw his PCP. His BP is controlled. Pain is controlled overall.  Pain Inventory Average Pain 4 Pain Right Now 4 My pain is aching  In the last 24 hours, has pain interfered with the following? General activity 0 Relation with others 0 Enjoyment of life 0 What TIME of day is your pain at its worst? na Sleep (in general) Fair  Pain is worse with: unsure Pain improves with: pacing activities Relief from Meds: na  Mobility walk without assistance ability to climb steps?  yes  Function retired  Neuro/Psych numbness trouble walking dizziness  Prior Studies Any changes since last visit?  no  Physicians involved in your care Any changes since last visit?  no   Family History  Problem Relation Age of Onset  . Colon cancer Neg Hx    Social History   Socioeconomic History  . Marital status: Married    Spouse name: Not on file  . Number of children: 2  . Years of education: Not on file  . Highest education level: Not on file  Occupational History  . Occupation: used to run night club  Social Needs  . Financial resource strain: Not on file  . Food insecurity:    Worry: Not on file    Inability: Not on file  . Transportation needs:    Medical: Not on file    Non-medical: Not on file  Tobacco Use  . Smoking status:  Former Smoker    Packs/day: 1.00    Years: 40.00    Pack years: 40.00    Types: Cigarettes    Last attempt to quit: 09/03/2007    Years since quitting: 10.4  . Smokeless tobacco: Never Used  Substance and Sexual Activity  . Alcohol use: Not Currently    Alcohol/week: 12.0 standard drinks    Types: 12 Cans of beer per week    Comment: beer, two nights a week (6-8 beers per night).  used to drink more  . Drug use: No  . Sexual activity: Not on file  Lifestyle  . Physical activity:    Days per week: Not on file    Minutes per session: Not on file  . Stress: Not on file  Relationships  . Social connections:    Talks on phone: Not on file    Gets together: Not on file    Attends religious service: Not on file    Active member of club or organization: Not on file    Attends meetings of clubs or organizations: Not on file    Relationship status: Not on file  Other Topics Concern  . Not on file  Social History Narrative  . Not on file   Past Surgical History:  Procedure Laterality Date  . COLONOSCOPY  pandya   6-7 years ago, some polyps  . COLONOSCOPY WITH PROPOFOL N/A  10/28/2015   Procedure: COLONOSCOPY WITH PROPOFOL;  Surgeon: Corbin Ade, MD;  [facility];  Service: Endoscopy;  Laterality: N/A;  0730  . ESOPHAGOGASTRODUODENOSCOPY     Barrett's esophagus per patient  . ESOPHAGOGASTRODUODENOSCOPY (EGD) WITH PROPOFOL N/A 10/28/2015   Procedure: ESOPHAGOGASTRODUODENOSCOPY (EGD) WITH PROPOFOL;  Surgeon: Corbin Ade, MD;  [facility];  Service: Endoscopy;  Laterality: N/A;  . removal of bone foot Left    age 64   Past Medical History:  Diagnosis Date  . Arthritis    osteoarthritis  . Atrial fibrillation (HCC)   . GERD (gastroesophageal reflux disease)    BP 128/71   Pulse (!) 52   Ht 6\' 2"  (1.88 m)   Wt 282 lb (127.9 kg)   SpO2 97%   BMI 36.21 kg/m   Opioid Risk Score:   Fall Risk Score:  `1  Depression screen PHQ 2/9  Depression  screen PHQ 2/9 12/25/2017  Decreased Interest 0  Down, Depressed, Hopeless 3  PHQ - 2 Score 3  Altered sleeping 3  Tired, decreased energy 3  Change in appetite 3  Feeling bad or failure about yourself  3  Trouble concentrating 0  Moving slowly or fidgety/restless 3  Suicidal thoughts 0  PHQ-9 Score 18  Difficult doing work/chores Very difficult     Review of Systems  Constitutional: Positive for unexpected weight change.  HENT: Negative.   Eyes: Negative.   Respiratory: Negative.   Cardiovascular: Negative.   Gastrointestinal: Negative.   Endocrine: Negative.   Genitourinary: Negative.   Musculoskeletal: Positive for gait problem.  Skin: Negative.   Allergic/Immunologic: Negative.   Neurological: Positive for dizziness, numbness and headaches.  Hematological: Bruises/bleeds easily.  Psychiatric/Behavioral: Negative.   All other systems reviewed and are negative.      Objective:   Physical Exam Constitutional: No distress . Vital signs reviewed. HENT: Normocephalic.  Atraumatic. Eyes: EOMI. No discharge. Cardiovascular: RRR. No JVD. Respiratory: CTA bilaterally. Normal effort. GI: BS +. Non-distended. Musc: No edema or tenderness in extremities.  Gait: Slightly wide based with some difficulty with tandem walking Neurological: He is alert.  Alert.  Speech is fluent. No dysarthria.  Follows commands.  Motor: 5/5 throughout No dysmetria or ataxia Mild dysdiadochokinesia on left Skin: Skin is warm. intact Psychiatric: Cooperative, pleasant     Assessment & Plan:  67 year old right-handed male with history of hypertension, hyperlipidemia, atrial fibrillation, maintained on Eliquis, remote tobacco abuse, presents for follow up for left middle cerebellar peduncle infarct.   1. Decreased functional mobility secondary to acute posterior fossa infarct centered in the left middle cerebellar peduncle  Cont HEP  Follow up with Neurology  Educated on gradual return to  driving with licensed driver  2. Pain Management:   Relatively controlled at present  3. Hypertension.    Cont meds  4. Gait abnormality  Safety precautions

## 2018-01-28 ENCOUNTER — Encounter (HOSPITAL_COMMUNITY): Payer: Medicare Other

## 2018-03-22 ENCOUNTER — Encounter (HOSPITAL_COMMUNITY): Payer: Self-pay

## 2018-04-25 ENCOUNTER — Encounter: Payer: Self-pay | Admitting: Physical Medicine & Rehabilitation

## 2018-04-25 ENCOUNTER — Encounter: Payer: Medicare Other | Attending: Physical Medicine & Rehabilitation | Admitting: Physical Medicine & Rehabilitation

## 2018-04-25 VITALS — BP 122/67 | HR 60 | Ht 74.0 in | Wt 284.0 lb

## 2018-04-25 DIAGNOSIS — E785 Hyperlipidemia, unspecified: Secondary | ICD-10-CM | POA: Insufficient documentation

## 2018-04-25 DIAGNOSIS — I10 Essential (primary) hypertension: Secondary | ICD-10-CM | POA: Insufficient documentation

## 2018-04-25 DIAGNOSIS — M1990 Unspecified osteoarthritis, unspecified site: Secondary | ICD-10-CM | POA: Insufficient documentation

## 2018-04-25 DIAGNOSIS — Z7901 Long term (current) use of anticoagulants: Secondary | ICD-10-CM | POA: Insufficient documentation

## 2018-04-25 DIAGNOSIS — I639 Cerebral infarction, unspecified: Secondary | ICD-10-CM | POA: Insufficient documentation

## 2018-04-25 DIAGNOSIS — R29818 Other symptoms and signs involving the nervous system: Secondary | ICD-10-CM | POA: Diagnosis not present

## 2018-04-25 DIAGNOSIS — I480 Paroxysmal atrial fibrillation: Secondary | ICD-10-CM | POA: Diagnosis not present

## 2018-04-25 DIAGNOSIS — M62838 Other muscle spasm: Secondary | ICD-10-CM

## 2018-04-25 DIAGNOSIS — Z9889 Other specified postprocedural states: Secondary | ICD-10-CM | POA: Insufficient documentation

## 2018-04-25 DIAGNOSIS — R269 Unspecified abnormalities of gait and mobility: Secondary | ICD-10-CM | POA: Insufficient documentation

## 2018-04-25 DIAGNOSIS — Z09 Encounter for follow-up examination after completed treatment for conditions other than malignant neoplasm: Secondary | ICD-10-CM | POA: Diagnosis present

## 2018-04-25 DIAGNOSIS — Z87891 Personal history of nicotine dependence: Secondary | ICD-10-CM | POA: Diagnosis not present

## 2018-04-25 MED ORDER — METHOCARBAMOL 500 MG PO TABS: 500.0000 mg | ORAL_TABLET | Freq: Two times a day (BID) | ORAL | 0 refills | Status: DC | PRN

## 2018-04-25 NOTE — Progress Notes (Addendum)
Subjective:    Patient ID: [facility], male    DOB: 04-Mar-1951, 67 y.o.   MRN: [id number]  HPI 67 year old right-handed male with history of hypertension, hyperlipidemia, atrial fibrillation, maintained on Eliquis, remote tobacco abuse, presents for follow up for left middle cerebellar peduncle infarct.   Last clinic visit on 01/25/2018.  Since that time patient states, he has been exercising at home. He saw Neurology. His main issues are dizziness and balance deficits that are transient. He is driving now. BP is controlled.   Pain Inventory Average Pain 5 Pain Right Now 5 My pain is burning, dull and aching  In the last 24 hours, has pain interfered with the following? General activity 5 Relation with others 0 Enjoyment of life 5 What TIME of day is your pain at its worst? morning Sleep (in general) Fair  Pain is worse with: walking, standing and some activites Pain improves with: rest, pacing activities and medication Relief from Meds: 3  Mobility walk without assistance ability to climb steps?  yes  Function retired  Neuro/Psych numbness trouble walking dizziness  Prior Studies Any changes since last visit?  no  Physicians involved in your care Any changes since last visit?  no   Family History  Problem Relation Age of Onset  . Colon cancer Neg Hx    Social History   Socioeconomic History  . Marital status: Married    Spouse name: Not on file  . Number of children: 2  . Years of education: Not on file  . Highest education level: Not on file  Occupational History  . Occupation: used to run night club  Social Needs  . Financial resource strain: Not on file  . Food insecurity:    Worry: Not on file    Inability: Not on file  . Transportation needs:    Medical: Not on file    Non-medical: Not on file  Tobacco Use  . Smoking status: Former Smoker    Packs/day: 1.00    Years: 40.00    Pack years: 40.00    Types: Cigarettes    Last attempt to  quit: 09/03/2007    Years since quitting: 10.6  . Smokeless tobacco: Never Used  Substance and Sexual Activity  . Alcohol use: Not Currently    Alcohol/week: 12.0 standard drinks    Types: 12 Cans of beer per week    Comment: beer, two nights a week (6-8 beers per night).  used to drink more  . Drug use: No  . Sexual activity: Not on file  Lifestyle  . Physical activity:    Days per week: Not on file    Minutes per session: Not on file  . Stress: Not on file  Relationships  . Social connections:    Talks on phone: Not on file    Gets together: Not on file    Attends religious service: Not on file    Active member of club or organization: Not on file    Attends meetings of clubs or organizations: Not on file    Relationship status: Not on file  Other Topics Concern  . Not on file  Social History Narrative  . Not on file   Past Surgical History:  Procedure Laterality Date  . COLONOSCOPY  pandya   6-7 years ago, some polyps  . COLONOSCOPY WITH PROPOFOL N/A 10/28/2015   Procedure: COLONOSCOPY WITH PROPOFOL;  Surgeon: Corbin Ade, MD;  [facility];  Service: Endoscopy;  Laterality:  N/A;  0730  . ESOPHAGOGASTRODUODENOSCOPY     Barrett's esophagus per patient  . ESOPHAGOGASTRODUODENOSCOPY (EGD) WITH PROPOFOL N/A 10/28/2015   Procedure: ESOPHAGOGASTRODUODENOSCOPY (EGD) WITH PROPOFOL;  Surgeon: Corbin Ade, MD;  [facility];  Service: Endoscopy;  Laterality: N/A;  . removal of bone foot Left    age 67   Past Medical History:  Diagnosis Date  . Arthritis    osteoarthritis  . Atrial fibrillation (HCC)   . GERD (gastroesophageal reflux disease)    BP 122/67   Pulse 60   Ht 6\' 2"  (1.88 m)   Wt 284 lb (128.8 kg)   SpO2 97%   BMI 36.46 kg/m   Opioid Risk Score:   Fall Risk Score:  `1  Depression screen PHQ 2/9  Depression screen PHQ 2/9 12/25/2017  Decreased Interest 0  Down, Depressed, Hopeless 3  PHQ - 2 Score 3  Altered sleeping 3  Tired,  decreased energy 3  Change in appetite 3  Feeling bad or failure about yourself  3  Trouble concentrating 0  Moving slowly or fidgety/restless 3  Suicidal thoughts 0  PHQ-9 Score 18  Difficult doing work/chores Very difficult     Review of Systems  Constitutional: Positive for unexpected weight change.  HENT: Negative.   Eyes: Negative.   Respiratory: Negative.   Cardiovascular: Negative.   Gastrointestinal: Negative.   Endocrine: Negative.   Genitourinary: Negative.   Musculoskeletal: Positive for gait problem.  Skin: Negative.   Allergic/Immunologic: Negative.   Neurological: Positive for dizziness, numbness and headaches.  Hematological: Bruises/bleeds easily.  Psychiatric/Behavioral: Negative.   All other systems reviewed and are negative.     Objective:   Physical Exam Constitutional: No distress . Vital signs reviewed. HENT: Normocephalic.  Atraumatic. Eyes: EOMI. No discharge. Cardiovascular:  RRR.  No JVD. Respiratory: CTA bilaterally.  Normal effort. GI: BS +. Non-distended. Musc: No edema or tenderness in extremities.  Gait: Grossly WNL Neurological: He is alert.  Alert.  Speech is fluent. No dysarthria.  Follows commands.  Motor: 5/5 throughout Minimal dysdiadochokinesia Skin: Skin is warm. intact Psychiatric: Cooperative, pleasant     Assessment & Plan:  67 year old right-handed male with history of hypertension, hyperlipidemia, atrial fibrillation, maintained on Eliquis, remote tobacco abuse, presents for follow up for left middle cerebellar peduncle infarct.   1. Decreased functional mobility secondary to acute posterior fossa infarct centered in the left middle cerebellar peduncle  Cont exercises, encouraged balance excercises  Cont follow up with Neurology  Has returned to driving without issues  2. Hypertension.    Controlled today  Cont meds  3. Gait abnormality  Safety precautions  Will consider medications for dizziness if necessary,  however, manageable and transient at present per pt  4. Muscle spasms in back  Robaxin 500 BID PRN ordered

## 2018-06-12 ENCOUNTER — Other Ambulatory Visit: Payer: Self-pay

## 2018-06-12 ENCOUNTER — Emergency Department (HOSPITAL_COMMUNITY): Payer: Medicare Other

## 2018-06-12 ENCOUNTER — Encounter (HOSPITAL_COMMUNITY): Payer: Self-pay

## 2018-06-12 ENCOUNTER — Emergency Department (HOSPITAL_COMMUNITY)
Admission: EM | Admit: 2018-06-12 | Discharge: 2018-06-12 | Disposition: A | Discharge status: Home / Self Care | Payer: Medicare Other | Attending: Emergency Medicine | Admitting: Emergency Medicine

## 2018-06-12 DIAGNOSIS — Z8673 Personal history of transient ischemic attack (TIA), and cerebral infarction without residual deficits: Secondary | ICD-10-CM | POA: Diagnosis not present

## 2018-06-12 DIAGNOSIS — I509 Heart failure, unspecified: Secondary | ICD-10-CM | POA: Diagnosis not present

## 2018-06-12 DIAGNOSIS — E785 Hyperlipidemia, unspecified: Secondary | ICD-10-CM | POA: Insufficient documentation

## 2018-06-12 DIAGNOSIS — I480 Paroxysmal atrial fibrillation: Secondary | ICD-10-CM | POA: Insufficient documentation

## 2018-06-12 DIAGNOSIS — Z7901 Long term (current) use of anticoagulants: Secondary | ICD-10-CM | POA: Insufficient documentation

## 2018-06-12 DIAGNOSIS — Z87891 Personal history of nicotine dependence: Secondary | ICD-10-CM | POA: Insufficient documentation

## 2018-06-12 DIAGNOSIS — Z79899 Other long term (current) drug therapy: Secondary | ICD-10-CM | POA: Insufficient documentation

## 2018-06-12 DIAGNOSIS — R0602 Shortness of breath: Secondary | ICD-10-CM | POA: Diagnosis present

## 2018-06-12 HISTORY — DX: Cerebral infarction, unspecified: I63.9

## 2018-06-12 LAB — COMPREHENSIVE METABOLIC PANEL
ALBUMIN: 3.8 g/dL (ref 3.5–5.0)
ALK PHOS: 92 U/L (ref 38–126)
ALT: 42 U/L (ref 0–44)
AST: 18 U/L (ref 15–41)
Anion gap: 11 (ref 5–15)
BUN: 22 mg/dL (ref 8–23)
CALCIUM: 9.4 mg/dL (ref 8.9–10.3)
CO2: 24 mmol/L (ref 22–32)
CREATININE: 0.99 mg/dL (ref 0.61–1.24)
Chloride: 106 mmol/L (ref 98–111)
GFR calc Af Amer: >60 mL/min (ref >60)
GFR calc non Af Amer: >60 mL/min (ref >60)
GLUCOSE: 187 mg/dL — AB (ref 70–99)
Potassium: 3.8 mmol/L (ref 3.5–5.1)
SODIUM: 141 mmol/L (ref 135–145)
Total Bilirubin: 1.3 mg/dL — ABNORMAL HIGH (ref 0.3–1.2)
Total Protein: 6.5 g/dL (ref 6.5–8.1)

## 2018-06-12 LAB — CBC WITH DIFFERENTIAL/PLATELET
ABS IMMATURE GRANULOCYTES: 0.05 10*3/uL (ref 0.00–0.07)
BASOS ABS: 0 10*3/uL (ref 0.0–0.1)
Basophils Relative: 0 %
Eosinophils Absolute: 0.1 10*3/uL (ref 0.0–0.5)
Eosinophils Relative: 1 %
HCT: 37.2 % — ABNORMAL LOW (ref 39.0–52.0)
Hemoglobin: 11.6 g/dL — ABNORMAL LOW (ref 13.0–17.0)
Immature Granulocytes: 0 %
Lymphocytes Relative: 7 %
Lymphs Abs: 0.8 10*3/uL (ref 0.7–4.0)
MCH: 29 pg (ref 26.0–34.0)
MCHC: 31.2 g/dL (ref 30.0–36.0)
MCV: 93 fL (ref 80.0–100.0)
MONO ABS: 0.8 10*3/uL (ref 0.1–1.0)
Monocytes Relative: 6 %
Neutro Abs: 10.3 10*3/uL — ABNORMAL HIGH (ref 1.7–7.7)
Neutrophils Relative %: 86 %
Platelets: 162 10*3/uL (ref 150–400)
RBC: 4 MIL/uL — AB (ref 4.22–5.81)
RDW: 15.2 % (ref 11.5–15.5)
WBC: 12 10*3/uL — AB (ref 4.0–10.5)
nRBC: 0 % (ref 0.0–0.2)

## 2018-06-12 LAB — URINALYSIS, ROUTINE W REFLEX MICROSCOPIC
Bilirubin Urine: NEGATIVE
Glucose, UA: NEGATIVE mg/dL
Hgb urine dipstick: NEGATIVE
Ketones, ur: NEGATIVE mg/dL
Leukocytes, UA: NEGATIVE
Nitrite: NEGATIVE
PH: 6 (ref 5.0–8.0)
Protein, ur: NEGATIVE mg/dL
SPECIFIC GRAVITY, URINE: 1.017 (ref 1.005–1.030)

## 2018-06-12 LAB — I-STAT TROPONIN, ED
Troponin i, poc: 0.01 ng/mL (ref 0.00–0.08)
Troponin i, poc: 0 ng/mL (ref 0.00–0.08)

## 2018-06-12 LAB — BRAIN NATRIURETIC PEPTIDE: B Natriuretic Peptide: 317.4 pg/mL — ABNORMAL HIGH (ref 0.0–100.0)

## 2018-06-12 MED ORDER — FUROSEMIDE 20 MG PO TABS: 20.0000 mg | ORAL_TABLET | Freq: Every day | ORAL | 0 refills | Status: AC

## 2018-06-12 MED ORDER — FUROSEMIDE 10 MG/ML IJ SOLN
40.0000 mg | Freq: Once | INTRAMUSCULAR | Status: AC
  Administered 2018-06-12: 40 mg via INTRAVENOUS
  Filled 2018-06-12: qty 4

## 2018-06-12 NOTE — Discharge Instructions (Signed)
Take lasix 20 mg daily for 3 days.   Avoid drinking too much fluids, eat low salt diet.   See your doctor or cardiologist next week to recheck kidney function and get echo of your heart   Return to ER if you have worse shortness of breath, leg swelling, trouble breathing

## 2018-06-12 NOTE — ED Notes (Signed)
Walked patient with pulse ox patient stayed at 92 room air patient is now back in bed on the monitor family at bedside and call bell in reach

## 2018-06-12 NOTE — ED Notes (Signed)
Patient verbalizes understanding of discharge instructions. Opportunity for questioning and answers were provided. Armband removed by staff, pt discharged from ED. Ambulated out into lobby  

## 2018-06-12 NOTE — ED Provider Notes (Signed)
[facility] Provider Note   CSN: [id number] Arrival date & time: 06/12/18  4098     History   Chief Complaint Chief Complaint  Patient presents with  . Shortness of Breath    HPI Miguel Alvarez is a 67 y.o. male hx of afib on eliquis, recent stroke here presenting with shortness of breath.  Patient states that he did go to a cruise and came back about a week ago.  He did eat a lot of food and did a lot of walking during the cruise.  Since he returned about a week ago, he noticed worsening shortness of breath.  He states that initially he has shortness of breath with exertion but noticed progressive leg swelling as well.  Last night, he was unable to sleep due to shortness of breath.  He has some dizziness since the recent stroke and that has not changed.  Denies any trouble speaking or weakness or numbness.  Denies any cough or fever.  Patient states that he has no history of blood clots or heart failure and is compliant with his Eliquis.   The history is provided by the patient.    Past Medical History:  Diagnosis Date  . Arthritis    osteoarthritis  . Atrial fibrillation (HCC)   . GERD (gastroesophageal reflux disease)   . Stroke [facility])     Patient Active Problem List   Diagnosis Date Noted  . Abnormality of gait 04/25/2018  . Benign essential HTN   . PAF (paroxysmal atrial fibrillation) (HCC)   . Cerebellar infarct (HCC) 12/05/2017  . Dizziness 12/02/2017  . Hyperlipidemia 12/02/2017  . Acute CVA (cerebrovascular accident) (HCC) 12/02/2017  . Atrial fibrillation, chronic 12/02/2017  . Mucosal abnormality of esophagus   . Diverticulosis of colon without hemorrhage   . Blood in stool   . Barrett's esophagus 09/03/2015  . GERD (gastroesophageal reflux disease) 09/03/2015  . Normocytic anemia 09/03/2015  . Heme positive stool 09/03/2015    Past Surgical History:  Procedure Laterality Date  . COLONOSCOPY  pandya   6-7 years  ago, some polyps  . COLONOSCOPY WITH PROPOFOL N/A 10/28/2015   Procedure: COLONOSCOPY WITH PROPOFOL;  Surgeon: Corbin Ade, MD;  [facility];  Service: Endoscopy;  Laterality: N/A;  0730  . ESOPHAGOGASTRODUODENOSCOPY     Barrett's esophagus per patient  . ESOPHAGOGASTRODUODENOSCOPY (EGD) WITH PROPOFOL N/A 10/28/2015   Procedure: ESOPHAGOGASTRODUODENOSCOPY (EGD) WITH PROPOFOL;  Surgeon: Corbin Ade, MD;  [facility];  Service: Endoscopy;  Laterality: N/A;  . removal of bone foot Left    age 53        Home Medications    Prior to Admission medications   Medication Sig Start Date End Date Taking? Authorizing Provider  apixaban (ELIQUIS) 5 MG TABS tablet Take 1 tablet (5 mg total) by mouth 2 (two) times daily. 12/12/17  Yes Angiulli, Mcarthur Rossetti, PA-C  atorvastatin (LIPITOR) 80 MG tablet Take 1 tablet (80 mg total) by mouth daily. 12/12/17  Yes Angiulli, Mcarthur Rossetti, PA-C  Dexlansoprazole (DEXILANT) 30 MG capsule Take 30 mg by mouth daily.   Yes [provider]  methocarbamol (ROBAXIN) 500 MG tablet Take 1 tablet (500 mg total) by mouth 2 (two) times daily as needed for muscle spasms. 04/25/18  Yes Marcello Fennel, MD  omeprazole (PRILOSEC) 40 MG capsule Take 40 mg by mouth daily.   Yes [provider]  predniSONE (DELTASONE) 5 MG tablet Take 5 mg by  mouth daily.   Yes [provider]  ramipril (ALTACE) 10 MG capsule Take 10 mg by mouth daily.   Yes [provider]  aspirin 325 MG tablet Take 1 tablet (325 mg total) by mouth daily. Patient not taking: Reported on 06/12/2018 12/12/17   Charlton AmorAngiulli, Daniel J, PA-C    Family History Family History  Problem Relation Age of Onset  . Colon cancer Neg Hx     Social History Social History   Tobacco Use  . Smoking status: Former Smoker    Packs/day: 1.00    Years: 40.00    Pack years: 40.00    Types: Cigarettes    Last attempt to quit: 09/03/2007    Years since quitting: 10.7  .  Smokeless tobacco: Never Used  Substance Use Topics  . Alcohol use: Not Currently    Alcohol/week: 12.0 standard drinks    Types: 12 Cans of beer per week    Comment: beer, two nights a week (6-8 beers per night).  used to drink more  . Drug use: No     Allergies   Patient has no known allergies.   Review of Systems Review of Systems  Respiratory: Positive for shortness of breath.   All other systems reviewed and are negative.    Physical Exam Updated Vital Signs BP (!) 148/60   Pulse 64   Temp 97.8 F (36.6 C) (Oral)   Resp 19   Ht 6\' 2"  (1.88 m)   Wt 131.5 kg   SpO2 95%   BMI 37.23 kg/m   Physical Exam Vitals signs and nursing note reviewed.  Constitutional:      Comments: Slightly tachypneic   HENT:     Head: Normocephalic.     Mouth/Throat:     Mouth: Mucous membranes are moist.  Eyes:     Extraocular Movements: Extraocular movements intact.     Pupils: Pupils are equal, round, and reactive to light.  Neck:     Musculoskeletal: Normal range of motion and neck supple.  Cardiovascular:     Rate and Rhythm: Normal rate and regular rhythm.  Pulmonary:     Comments: Slightly tachypneic, diminished bilateral bases  Musculoskeletal:     Comments: 2+ edema bilaterally, no calf tenderness   Skin:    General: Skin is warm.     Capillary Refill: Capillary refill takes less than 2 seconds.  Neurological:     General: No focal deficit present.     Mental Status: He is alert.  Psychiatric:        Mood and Affect: Mood normal.        Behavior: Behavior normal.      ED Treatments / Results  Labs (all labs ordered are listed, but only abnormal results are displayed) Labs Reviewed  CBC WITH DIFFERENTIAL/PLATELET - Abnormal; Notable for the following components:      Result Value   WBC 12.0 (*)    RBC 4.00 (*)    Hemoglobin 11.6 (*)    HCT 37.2 (*)    Neutro Abs 10.3 (*)    All other components within normal limits  COMPREHENSIVE METABOLIC PANEL -  Abnormal; Notable for the following components:   Glucose, Bld 187 (*)    Total Bilirubin 1.3 (*)    All other components within normal limits  BRAIN NATRIURETIC PEPTIDE - Abnormal; Notable for the following components:   B Natriuretic Peptide 317.4 (*)    All other components within normal limits  URINALYSIS, ROUTINE W  REFLEX MICROSCOPIC  I-STAT TROPONIN, ED  I-STAT TROPONIN, ED    EKG EKG Interpretation  Date/Time:  Wednesday June 12 2018 09:30:42 EST Ventricular Rate:  68 PR Interval:    QRS Duration: 87 QT Interval:  409 QTC Calculation: 435 R Axis:   113 Text Interpretation:  Sinus rhythm Atrial premature complex Short PR interval Lateral infarct, age indeterminate Anteroseptal infarct, old No significant change since last tracing Confirmed by Richardean CanalYao, Aneliz Carbary H [id number]) on 06/12/2018 9:45:39 AM   Radiology Dg Chest 2 View  Result Date: 06/12/2018 CLINICAL DATA:  Shortness of breath beginning at 1:30 a.m. Bilateral leg swelling starting 1 week ago. EXAM: CHEST - 2 VIEW COMPARISON:  None. FINDINGS: Mild enlargement of the cardiopericardial silhouette with indistinct pulmonary vasculature, mild interstitial accentuation, and blunting of both posterior costophrenic angles. Faint Kerley B lines noted. Atherosclerotic calcification of the aortic arch. IMPRESSION: 1. Appearance favoring mild congestive heart failure with interstitial edema small bilateral pleural effusions. Electronically Signed   By: Gaylyn RongWalter  Liebkemann M.D.   On: 06/12/2018 10:37    Procedures Procedures (including critical care time)  Medications Ordered in ED Medications  furosemide (LASIX) injection 40 mg (40 mg Intravenous Given 06/12/18 1106)     Initial Impression / Assessment and Plan / ED Course  I have reviewed the triage vital signs and the nursing notes.  Pertinent labs & imaging results that were available during my care of the patient were reviewed by me and considered in my medical decision  making (see chart for details).    Brita Romphomas Zammit is a 67 y.o. male here with SOB, leg swelling. Recently went on a cruise. Consider new onset CHF vs renal failure. Patient is compliant with eliquis and I have low suspicion for PE. Will get labs, BNP, CXR.  1:13 PM  CXR showed mild heart failure. BNP 317. Labs otherwise unremarkable. Trop neg x 2. Given lasix 40 mg IV and urinated more than a liter. Ambulated and maintained oxygen saturation above 93%. I think likely new onset CHF vs edema from eating too much salt on the cruise. Will dc home with 3 days of lasix 20 mg daily. Will have him recheck BMP and echo with [facility] at [facility]. Told him to eat low salt, restrict fluid intake    Final Clinical Impressions(s) / ED Diagnoses   Final diagnoses:  None    ED Discharge Orders    None       Charlynne PanderYao, Arnesha Schiraldi Hsienta, MD 06/12/18 1315

## 2018-06-12 NOTE — ED Triage Notes (Signed)
Pt c/o SOB that began around 130 am today ; pt denies any cough or CP ; pt also c/o bilateral leg swelling that began x 1 week ago ; pt does state he recently went on a cruise x 1week ago

## 2018-06-21 ENCOUNTER — Emergency Department (HOSPITAL_COMMUNITY)
Admission: EM | Admit: 2018-06-21 | Discharge: 2018-06-22 | Disposition: A | Discharge status: Home / Self Care | Payer: Medicare Other | Attending: Emergency Medicine | Admitting: Emergency Medicine

## 2018-06-21 ENCOUNTER — Encounter (HOSPITAL_COMMUNITY): Payer: Self-pay | Admitting: *Deleted

## 2018-06-21 DIAGNOSIS — I480 Paroxysmal atrial fibrillation: Secondary | ICD-10-CM | POA: Insufficient documentation

## 2018-06-21 DIAGNOSIS — E785 Hyperlipidemia, unspecified: Secondary | ICD-10-CM | POA: Diagnosis not present

## 2018-06-21 DIAGNOSIS — Z79899 Other long term (current) drug therapy: Secondary | ICD-10-CM | POA: Insufficient documentation

## 2018-06-21 DIAGNOSIS — I10 Essential (primary) hypertension: Secondary | ICD-10-CM | POA: Insufficient documentation

## 2018-06-21 DIAGNOSIS — Z8673 Personal history of transient ischemic attack (TIA), and cerebral infarction without residual deficits: Secondary | ICD-10-CM | POA: Diagnosis not present

## 2018-06-21 DIAGNOSIS — Z87891 Personal history of nicotine dependence: Secondary | ICD-10-CM | POA: Insufficient documentation

## 2018-06-21 DIAGNOSIS — Z7901 Long term (current) use of anticoagulants: Secondary | ICD-10-CM | POA: Insufficient documentation

## 2018-06-21 DIAGNOSIS — N179 Acute kidney failure, unspecified: Secondary | ICD-10-CM | POA: Diagnosis not present

## 2018-06-21 DIAGNOSIS — R799 Abnormal finding of blood chemistry, unspecified: Secondary | ICD-10-CM | POA: Diagnosis present

## 2018-06-21 LAB — CBC WITH DIFFERENTIAL/PLATELET
Abs Immature Granulocytes: 0.04 10*3/uL (ref 0.00–0.07)
Basophils Absolute: 0.1 10*3/uL (ref 0.0–0.1)
Basophils Relative: 0 %
Eosinophils Absolute: 0.1 10*3/uL (ref 0.0–0.5)
Eosinophils Relative: 0 %
HEMATOCRIT: 47.7 % (ref 39.0–52.0)
Hemoglobin: 15.3 g/dL (ref 13.0–17.0)
Immature Granulocytes: 0 %
Lymphocytes Relative: 13 %
Lymphs Abs: 1.5 10*3/uL (ref 0.7–4.0)
MCH: 29 pg (ref 26.0–34.0)
MCHC: 32.1 g/dL (ref 30.0–36.0)
MCV: 90.5 fL (ref 80.0–100.0)
MONO ABS: 0.6 10*3/uL (ref 0.1–1.0)
Monocytes Relative: 6 %
Neutro Abs: 9.3 10*3/uL — ABNORMAL HIGH (ref 1.7–7.7)
Neutrophils Relative %: 81 %
Platelets: 297 10*3/uL (ref 150–400)
RBC: 5.27 MIL/uL (ref 4.22–5.81)
RDW: 14.3 % (ref 11.5–15.5)
WBC: 11.5 10*3/uL — ABNORMAL HIGH (ref 4.0–10.5)
nRBC: 0 % (ref 0.0–0.2)

## 2018-06-21 LAB — COMPREHENSIVE METABOLIC PANEL
ALT: 37 U/L (ref 0–44)
AST: 21 U/L (ref 15–41)
Albumin: 4.3 g/dL (ref 3.5–5.0)
Alkaline Phosphatase: 114 U/L (ref 38–126)
Anion gap: 10 (ref 5–15)
BUN: 33 mg/dL — ABNORMAL HIGH (ref 8–23)
CO2: 24 mmol/L (ref 22–32)
Calcium: 9.8 mg/dL (ref 8.9–10.3)
Chloride: 106 mmol/L (ref 98–111)
Creatinine, Ser: 1.48 mg/dL — ABNORMAL HIGH (ref 0.61–1.24)
GFR calc Af Amer: 56 mL/min — ABNORMAL LOW (ref >60)
GFR, EST NON AFRICAN AMERICAN: 48 mL/min — AB (ref >60)
Glucose, Bld: 194 mg/dL — ABNORMAL HIGH (ref 70–99)
Potassium: 4.9 mmol/L (ref 3.5–5.1)
Sodium: 140 mmol/L (ref 135–145)
Total Bilirubin: 1.1 mg/dL (ref 0.3–1.2)
Total Protein: 8.1 g/dL (ref 6.5–8.1)

## 2018-06-21 MED ORDER — SODIUM CHLORIDE 0.9 % IV BOLUS
500.0000 mL | Freq: Once | INTRAVENOUS | Status: AC
  Administered 2018-06-21: 500 mL via INTRAVENOUS

## 2018-06-21 NOTE — ED Triage Notes (Signed)
Pt in from PCP office after abnormal potassium level, told to come to ED for recheck and further evaluation

## 2018-06-21 NOTE — ED Provider Notes (Signed)
[facility] Provider Note   CSN: [id number] Arrival date & time: 06/21/18  1412     History   Chief Complaint Chief Complaint  Patient presents with  . Abnormal Lab    HPI Miguel Alvarez is a 68 y.o. male with a history of atrial fibrillation on apixaban, CVA, GERD, HTN, diverticulosis, and Barrett's esophagus who presents to the emergency department with a chief complaint of abnormal lab results.  The patient reports that he received a call from his cardiologist informing him that his potassium was greater than 6 and advised him to come to the emergency department for further evaluation.  Reports over the last few weeks he had had worsening shortness of breath and progressively worsening bilateral leg swelling, and orthopnea after returning from a cruise.  He was evaluated for the same in the ED on 06/12/18 was discharged with furosemide.   He reports that he had an 11 pound weight loss after he was started on furosemide and was changed to torsemide by Dr. Nobie PutnamNeil Freedman, [facility] on 12/30.  States that his weight loss is now up to 15 pounds.  Today, he denies chest pain, shortness of breath, leg swelling, confusion, abdominal pain, nausea, vomiting, diarrhea.   The history is provided by the patient. No language interpreter was used.    Past Medical History:  Diagnosis Date  . Arthritis    osteoarthritis  . Atrial fibrillation (HCC)   . GERD (gastroesophageal reflux disease)   . Stroke [facility](HCC)     Patient Active Problem List   Diagnosis Date Noted  . Abnormality of gait 04/25/2018  . Benign essential HTN   . PAF (paroxysmal atrial fibrillation) (HCC)   . Cerebellar infarct (HCC) 12/05/2017  . Dizziness 12/02/2017  . Hyperlipidemia 12/02/2017  . Acute CVA (cerebrovascular accident) (HCC) 12/02/2017  . Atrial fibrillation, chronic 12/02/2017  . Mucosal abnormality of esophagus   . Diverticulosis of colon without hemorrhage     . Blood in stool   . Barrett's esophagus 09/03/2015  . GERD (gastroesophageal reflux disease) 09/03/2015  . Normocytic anemia 09/03/2015  . Heme positive stool 09/03/2015    Past Surgical History:  Procedure Laterality Date  . COLONOSCOPY  pandya   6-7 years ago, some polyps  . COLONOSCOPY WITH PROPOFOL N/A 10/28/2015   Procedure: COLONOSCOPY WITH PROPOFOL;  Surgeon: Corbin Adeobert M Rourk, MD;  [facility]: [facility];  Service: Endoscopy;  Laterality: N/A;  0730  . ESOPHAGOGASTRODUODENOSCOPY     Barrett's esophagus per patient  . ESOPHAGOGASTRODUODENOSCOPY (EGD) WITH PROPOFOL N/A 10/28/2015   Procedure: ESOPHAGOGASTRODUODENOSCOPY (EGD) WITH PROPOFOL;  Surgeon: Corbin Adeobert M Rourk, MD;  Location: [facility];  Service: Endoscopy;  Laterality: N/A;  . removal of bone foot Left    age 68        Home Medications    Prior to Admission medications   Medication Sig Start Date End Date Taking? Authorizing Provider  apixaban (ELIQUIS) 5 MG TABS tablet Take 1 tablet (5 mg total) by mouth 2 (two) times daily. 12/12/17   Angiulli, Mcarthur Rossettianiel J, PA-C  aspirin 325 MG tablet Take 1 tablet (325 mg total) by mouth daily. Patient not taking: Reported on 06/12/2018 12/12/17   Angiulli, Mcarthur Rossettianiel J, PA-C  atorvastatin (LIPITOR) 80 MG tablet Take 1 tablet (80 mg total) by mouth daily. 12/12/17   Angiulli, Mcarthur Rossettianiel J, PA-C  Dexlansoprazole (DEXILANT) 30 MG capsule Take 30 mg by mouth daily.    [provider]  furosemide (LASIX)  20 MG tablet Take 1 tablet (20 mg total) by mouth daily. 06/12/18   Charlynne PanderYao, David Hsienta, MD  methocarbamol (ROBAXIN) 500 MG tablet Take 1 tablet (500 mg total) by mouth 2 (two) times daily as needed for muscle spasms. 04/25/18   Marcello FennelPatel, Ankit Anil, MD  omeprazole (PRILOSEC) 40 MG capsule Take 40 mg by mouth daily.    [provider]  predniSONE (DELTASONE) 5 MG tablet Take 5 mg by mouth daily.    [provider]  ramipril (ALTACE) 10 MG capsule Take 10 mg by mouth daily.     [provider]    Family History Family History  Problem Relation Age of Onset  . Colon cancer Neg Hx     Social History Social History   Tobacco Use  . Smoking status: Former Smoker    Packs/day: 1.00    Years: 40.00    Pack years: 40.00    Types: Cigarettes    Last attempt to quit: 09/03/2007    Years since quitting: 10.8  . Smokeless tobacco: Never Used  Substance Use Topics  . Alcohol use: Not Currently    Alcohol/week: 12.0 standard drinks    Types: 12 Cans of beer per week    Comment: beer, two nights a week (6-8 beers per night).  used to drink more  . Drug use: No     Allergies   Patient has no known allergies.   Review of Systems Review of Systems  Constitutional: Negative for appetite change, chills and fever.  Eyes: Negative for visual disturbance.  Respiratory: Negative for cough and shortness of breath.   Cardiovascular: Negative for chest pain, palpitations and leg swelling.  Gastrointestinal: Negative for abdominal pain, diarrhea and vomiting.  Genitourinary: Negative for dysuria.  Musculoskeletal: Negative for back pain.  Skin: Negative for rash.  Allergic/Immunologic: Negative for immunocompromised state.  Neurological: Negative for weakness, numbness and headaches.  Psychiatric/Behavioral: Negative for confusion.   Physical Exam Updated Vital Signs BP 117/78   Pulse 77   Temp 98.6 F (37 C) (Oral)   Resp 16   SpO2 99%   Physical Exam Vitals signs and nursing note reviewed.  Constitutional:      Appearance: He is well-developed.  HENT:     Head: Normocephalic.  Eyes:     Conjunctiva/sclera: Conjunctivae normal.  Neck:     Musculoskeletal: Neck supple.  Cardiovascular:     Rate and Rhythm: Normal rate and regular rhythm.     Pulses: Normal pulses.     Heart sounds: Normal heart sounds. No murmur. No friction rub. No gallop.   Pulmonary:     Effort: Pulmonary effort is normal. No respiratory distress.     Breath sounds:  No stridor. No wheezing, rhonchi or rales.     Comments: Lungs are clear to auscultation bilaterally.  No increased work of breathing. Abdominal:     General: There is no distension.     Palpations: Abdomen is soft.     Tenderness: There is no abdominal tenderness. There is no right CVA tenderness, left CVA tenderness, guarding or rebound.     Comments: No fluid wave  Musculoskeletal:        General: No swelling or tenderness.     Right lower leg: No edema.     Left lower leg: No edema.  Skin:    General: Skin is warm and dry.     Capillary Refill: Capillary refill takes less than 2 seconds.  Neurological:  General: No focal deficit present.     Mental Status: He is alert.  Psychiatric:        Behavior: Behavior normal.    ED Treatments / Results  Labs (all labs ordered are listed, but only abnormal results are displayed) Labs Reviewed  CBC WITH DIFFERENTIAL/PLATELET - Abnormal; Notable for the following components:      Result Value   WBC 11.5 (*)    Neutro Abs 9.3 (*)    All other components within normal limits  COMPREHENSIVE METABOLIC PANEL - Abnormal; Notable for the following components:   Glucose, Bld 194 (*)    BUN 33 (*)    Creatinine, Ser 1.48 (*)    GFR calc non Af Amer 48 (*)    GFR calc Af Amer 56 (*)    All other components within normal limits  BASIC METABOLIC PANEL - Abnormal; Notable for the following components:   Glucose, Bld 117 (*)    BUN 34 (*)    Creatinine, Ser 1.26 (*)    GFR calc non Af Amer 59 (*)    All other components within normal limits    EKG EKG Interpretation  Date/Time:  Friday June 21 2018 14:17:47 EST Ventricular Rate:  91 PR Interval:    QRS Duration: 90 QT Interval:  358 QTC Calculation: 440 R Axis:   93 Text Interpretation:  Atrial fibrillation Rightward axis Septal infarct , age undetermined Abnormal ECG Confirmed by Blane Ohara [id number]) on 06/21/2018 10:00:29 PM   Radiology No results  found.  Procedures Procedures (including critical care time)  Medications Ordered in ED Medications  sodium chloride 0.9 % bolus 500 mL (0 mLs Intravenous Stopped 06/21/18 2341)     Initial Impression / Assessment and Plan / ED Course  I have reviewed the triage vital signs and the nursing notes.  Pertinent labs & imaging results that were available during my care of the patient were reviewed by me and considered in my medical decision making (see chart for details).     68 year old male with a history of atrial fibrillation on apixaban, CVA, GERD, HTN, diverticulosis, and Barrett's esophagus presenting with concern for abnormal lab result.  Was notified by cardiology today that his potassium was greater than 6 and was advised to come to the emergency department for further work-up and evaluation.  The patient was discussed and independently evaluated by Dr. Erma Heritage, attending physician.  He is asymptomatic.  Reports he was initially treated with furosemide and changed to torsemide over the last few days for leg swelling and anasarca after going on a cruise.  Notes a 15 pound weight loss in the last 5 days.   EKG with rate controlled atrial fibrillation.  Potassium is 4.9.  Patient's creatinine is 1.48, up from 1.0 on 06/17/2018.  I suspect this is secondary to recent diuresis with torsemide as his BUN to creatinine ratio is greater than 20-1 prerenal azotemia.  Will plan to give the patient a 500 cc fluid bolus and recheck BMP.   Spoke with Dr. Frederica Kuster with [facility] as the patient's cardiologist asked to be kept in the communication loop while the patient was in the ER.  Discussed current labs and plan for AKI in the ED.  Question if potassium greater than 6 was due to lab hemolysis.  She was in agreement with the work-up and plan and recommended having the patient reach out to his cardiologist when they reopen in 2 days for a repeat BMP.  Repeat BMP  in the ER with creatinine of 1.26.   Potassium is 4.8.  Discussed lab findings with the patient and his wife.  He plans to call his cardiologist to schedule a follow-up appointment and labs.  Strict return precautions given.  He is hemodynamically stable and in no acute distress.  He is safe for discharge to home with outpatient follow-up at this time.  Final Clinical Impressions(s) / ED Diagnoses   Final diagnoses:  AKI (acute kidney injury) [facility])    ED Discharge Orders    None       Wilian Kwong A, PA-C 06/22/18 0131    Shaune Pollack, MD 06/22/18 1356

## 2018-06-22 LAB — BASIC METABOLIC PANEL
Anion gap: 9 (ref 5–15)
BUN: 34 mg/dL — ABNORMAL HIGH (ref 8–23)
CALCIUM: 9.4 mg/dL (ref 8.9–10.3)
CO2: 28 mmol/L (ref 22–32)
Chloride: 103 mmol/L (ref 98–111)
Creatinine, Ser: 1.26 mg/dL — ABNORMAL HIGH (ref 0.61–1.24)
GFR calc Af Amer: >60 mL/min (ref >60)
GFR calc non Af Amer: 59 mL/min — ABNORMAL LOW (ref >60)
Glucose, Bld: 117 mg/dL — ABNORMAL HIGH (ref 70–99)
Potassium: 4.8 mmol/L (ref 3.5–5.1)
Sodium: 140 mmol/L (ref 135–145)

## 2018-06-22 NOTE — Discharge Instructions (Signed)
Thank you for allowing me to care for you today in the Emergency Department.   Please call your cardiologist's office when they re-open on Monday to inquire about having your metabolic panel (BMP) repeated.  Return to the emergency department if you develop worsening leg swelling, shortness of breath, chest pain, if you stop making urine, if your urine turns and cola colored or black, or if you develop other new, concerning symptoms.

## 2018-06-22 NOTE — ED Notes (Signed)
Pt stable and ambulatory for discharge, states understanding follow up.  

## 2018-06-26 ENCOUNTER — Other Ambulatory Visit: Payer: Self-pay

## 2018-06-27 LAB — BASIC METABOLIC PANEL
BUN / CREAT RATIO: 26 — AB (ref 10–24)
BUN: 31 mg/dL — ABNORMAL HIGH (ref 8–27)
CO2: 21 mmol/L (ref 20–29)
Calcium: 10 mg/dL (ref 8.6–10.2)
Chloride: 102 mmol/L (ref 96–106)
Creatinine, Ser: 1.18 mg/dL (ref 0.76–1.27)
GFR calc Af Amer: 73 mL/min/{1.73_m2} (ref >59)
GFR calc non Af Amer: 63 mL/min/{1.73_m2} (ref >59)
Glucose: 139 mg/dL — ABNORMAL HIGH (ref 65–99)
Potassium: 4.9 mmol/L (ref 3.5–5.2)
SODIUM: 140 mmol/L (ref 134–144)

## 2018-06-27 LAB — MAGNESIUM: Magnesium: 2 mg/dL (ref 1.6–2.3)

## 2018-10-16 HISTORY — PX: PACEMAKER IMPLANT: EP1218

## 2018-10-24 ENCOUNTER — Encounter: Payer: Medicare Other | Attending: Physical Medicine & Rehabilitation | Admitting: Physical Medicine & Rehabilitation

## 2018-10-24 ENCOUNTER — Other Ambulatory Visit: Payer: Self-pay

## 2018-10-24 ENCOUNTER — Encounter: Payer: Self-pay | Admitting: Physical Medicine & Rehabilitation

## 2018-10-24 VITALS — BP 118/62 | HR 65 | Ht 74.0 in | Wt 278.0 lb

## 2018-10-24 DIAGNOSIS — R269 Unspecified abnormalities of gait and mobility: Secondary | ICD-10-CM

## 2018-10-24 DIAGNOSIS — I6930 Unspecified sequelae of cerebral infarction: Secondary | ICD-10-CM | POA: Insufficient documentation

## 2018-10-24 DIAGNOSIS — M62838 Other muscle spasm: Secondary | ICD-10-CM

## 2018-10-24 DIAGNOSIS — I10 Essential (primary) hypertension: Secondary | ICD-10-CM | POA: Diagnosis not present

## 2018-10-24 NOTE — Progress Notes (Signed)
Subjective:    Patient ID: Miguel Alvarez, male    DOB: December 06, 1950, 68 y.o.   MRN: [id number]  TELEHEALTH NOTE  Due to national recommendations of social distancing due to COVID 19, an audio/video telehealth visit is felt to be most appropriate for this patient at this time.  See Chart message from today for the patient's consent to telehealth from [facility].     I verified that I am speaking with the correct person using two identifiers.  Location of patient: Home Location of provider: Office Method of communication: Telephone Names of participants : Wadie Lessen scheduling, Barbee Shropshire obtaining consent and vitals if available Established patient Time spent on call: 11 minutes  HPI Right-handed male with history of hypertension, hyperlipidemia, atrial fibrillation, maintained on Eliquis, remote tobacco abuse, presents for follow up for left middle cerebellar peduncle infarct.   Last clinic visit on 04/25/2018.  Wife supplements history. Since that time he went to the ED x2.  The first time for shortness of breath and the second time per recommendation from PCP for?  Abnormal potassium level.  Notes reviewed. He states he had PPM placed last week. Dizziness has improved since that time. He is doing HEP. He is following up with Neurology. BP has been controlled. Denies falls. Back spasms have improved.  Not using assistive device.   Pain Inventory Average Pain 7 Pain Right Now 7 My pain is sharp, stabbing and aching  In the last 24 hours, has pain interfered with the following? General activity 5 Relation with others 0 Enjoyment of life 5 What TIME of day is your pain at its worst? daytime Sleep (in general) Good  Pain is worse with: walking, standing and some activites Pain improves with: rest Relief from Meds: NA  Mobility walk without assistance ability to climb steps?  yes do you drive?  yes  Function retired  Neuro/Psych No  problems in this area  Prior Studies x-rays CT/MRI  Physicians involved in your care pacemaker insertion, surgeon   Family History  Problem Relation Age of Onset  . Colon cancer Neg Hx    Social History   Socioeconomic History  . Marital status: Married    Spouse name: Not on file  . Number of children: 2  . Years of education: Not on file  . Highest education level: Not on file  Occupational History  . Occupation: used to run night club  Social Needs  . Financial resource strain: Not on file  . Food insecurity:    Worry: Not on file    Inability: Not on file  . Transportation needs:    Medical: Not on file    Non-medical: Not on file  Tobacco Use  . Smoking status: Former Smoker    Packs/day: 1.00    Years: 40.00    Pack years: 40.00    Types: Cigarettes    Last attempt to quit: 09/03/2007    Years since quitting: 11.1  . Smokeless tobacco: Never Used  Substance and Sexual Activity  . Alcohol use: Not Currently    Alcohol/week: 12.0 standard drinks    Types: 12 Cans of beer per week    Comment: beer, two nights a week (6-8 beers per night).  used to drink more  . Drug use: No  . Sexual activity: Not on file  Lifestyle  . Physical activity:    Days per week: Not on file    Minutes per session: Not on file  .  Stress: Not on file  Relationships  . Social connections:    Talks on phone: Not on file    Gets together: Not on file    Attends religious service: Not on file    Active member of club or organization: Not on file    Attends meetings of clubs or organizations: Not on file    Relationship status: Not on file  Other Topics Concern  . Not on file  Social History Narrative  . Not on file   Past Surgical History:  Procedure Laterality Date  . COLONOSCOPY  pandya   6-7 years ago, some polyps  . COLONOSCOPY WITH PROPOFOL N/A 10/28/2015   Procedure: COLONOSCOPY WITH PROPOFOL;  Surgeon: Corbin Adeobert M Rourk, MD;  [facility]: [facility];  Service: Endoscopy;   Laterality: N/A;  0730  . ESOPHAGOGASTRODUODENOSCOPY     Barrett's esophagus per patient  . ESOPHAGOGASTRODUODENOSCOPY (EGD) WITH PROPOFOL N/A 10/28/2015   Procedure: ESOPHAGOGASTRODUODENOSCOPY (EGD) WITH PROPOFOL;  Surgeon: Corbin Adeobert M Rourk, MD;  Location: [facility];  Service: Endoscopy;  Laterality: N/A;  . PACEMAKER IMPLANT Left 10/16/2018  . removal of bone foot Left    age 68   Past Medical History:  Diagnosis Date  . Arthritis    osteoarthritis  . Atrial fibrillation (HCC)   . GERD (gastroesophageal reflux disease)   . Stroke (HCC)    BP 118/62   Pulse 65   Ht 6\' 2"  (1.88 m)   Wt 278 lb (126.1 kg)   BMI 35.69 kg/m   Opioid Risk Score:   Fall Risk Score:  `1  Depression screen PHQ 2/9  Depression screen PHQ 2/9 12/25/2017  Decreased Interest 0  Down, Depressed, Hopeless 3  PHQ - 2 Score 3  Altered sleeping 3  Tired, decreased energy 3  Change in appetite 3  Feeling bad or failure about yourself  3  Trouble concentrating 0  Moving slowly or fidgety/restless 3  Suicidal thoughts 0  PHQ-9 Score 18  Difficult doing work/chores Very difficult     Review of Systems  Constitutional: Negative.   HENT: Negative.   Eyes: Negative.   Respiratory: Negative.   Cardiovascular: Negative.   Gastrointestinal: Negative.   Endocrine: Negative.   Genitourinary: Negative.   Musculoskeletal: Negative.   Skin: Negative.   Allergic/Immunologic: Negative.   Neurological: Negative.        Bilateral foot pain  Hematological: Negative.   Psychiatric/Behavioral: Negative.   All other systems reviewed and are negative.     Objective:   Physical Exam Gen: NAD. Pulm: Effort normal Neuro: Alert and oriented    Assessment & Plan:  Right-handed male with history of hypertension, hyperlipidemia, atrial fibrillation, maintained on Eliquis, remote tobacco abuse, presents for follow up for left middle cerebellar peduncle infarct.   1. Decreased functional mobility secondary to  acute posterior fossa infarct centered in the left middle cerebellar peduncle  Cont HEP  Cont follow up with Neurology  2. Hypertension.    Controlled per patient   Cont meds  3. Gait abnormality  Safety precautions  Cont HEP  4. Muscle spasms in back  Improved, using ~2/month

## 2019-04-11 ENCOUNTER — Telehealth: Payer: Self-pay | Admitting: *Deleted

## 2019-04-11 MED ORDER — METHOCARBAMOL 500 MG PO TABS: 500.0000 mg | ORAL_TABLET | Freq: Two times a day (BID) | ORAL | 0 refills | Status: AC | PRN

## 2019-04-11 NOTE — Telephone Encounter (Signed)
We can give him 1 refill.  He said he was using approximately 2/month, so a 60 day supply should last him a year.  Thanks.

## 2019-04-11 NOTE — Telephone Encounter (Signed)
Sent to the pharmacy

## 2019-04-11 NOTE — Addendum Note (Signed)
Addended by: Caro Hight on: 04/11/2019 03:23 PM   Modules accepted: Orders

## 2019-04-11 NOTE — Telephone Encounter (Signed)
We have received 3 faxes from [facility] pharmacy for a refill on Mr Miguel Alvarez's methocarbamol.  Your note says return in a year.  He has appointment for 10/23/19. Do you want to refill this medication?

## 2019-04-14 ENCOUNTER — Other Ambulatory Visit: Payer: Self-pay

## 2019-10-23 ENCOUNTER — Encounter: Payer: Medicare Other | Admitting: Physical Medicine & Rehabilitation

## 2020-04-06 IMAGING — CT CT HEAD W/O CM
3 series · 15 of 47 positions shown, 18 images · non-contrast
Comparison: None.

CLINICAL DATA: Weakness, dizziness, difficulty walking.

EXAM:
CT HEAD WITHOUT CONTRAST
TECHNIQUE: Contiguous axial images were obtained from the base of the skull
through the vertex without intravenous contrast.

[Series 2: head wo · axial · 0.46mm/px · z∈[+1454,+1589]mm · 9 of 33 slices shown, 12 images]
[im 3/33  brain]
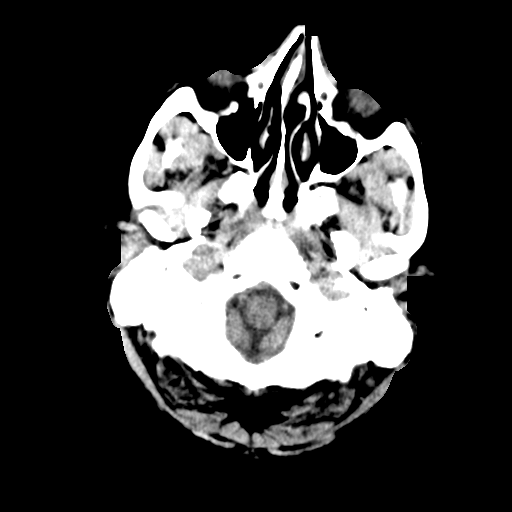
[im 3/33  bone]
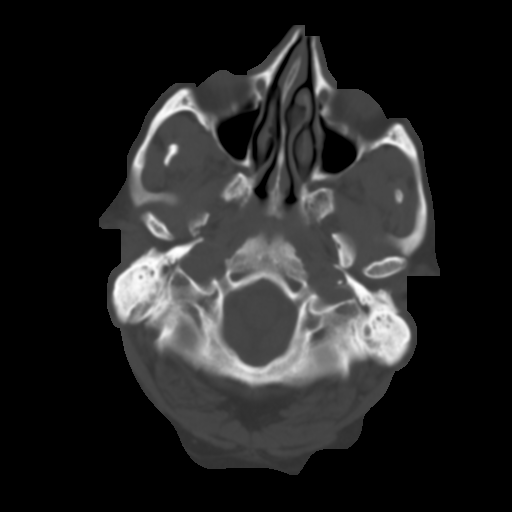
[im 6/33  brain]
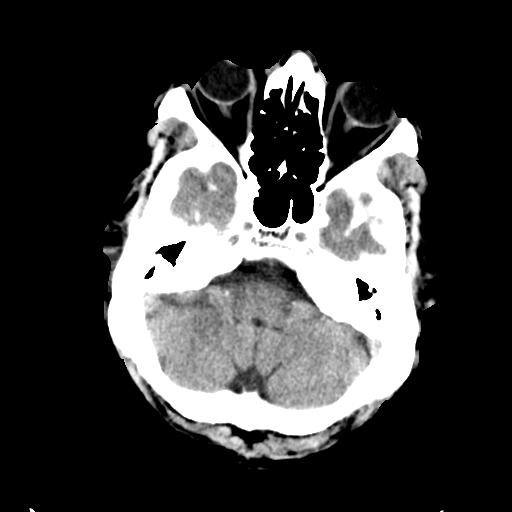
[im 9/33  brain]
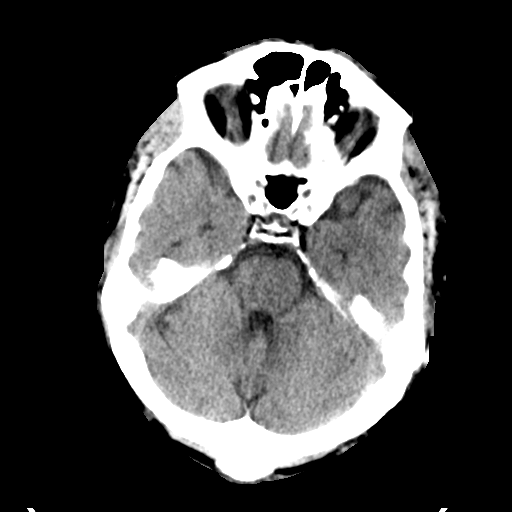
[im 13/33  brain]
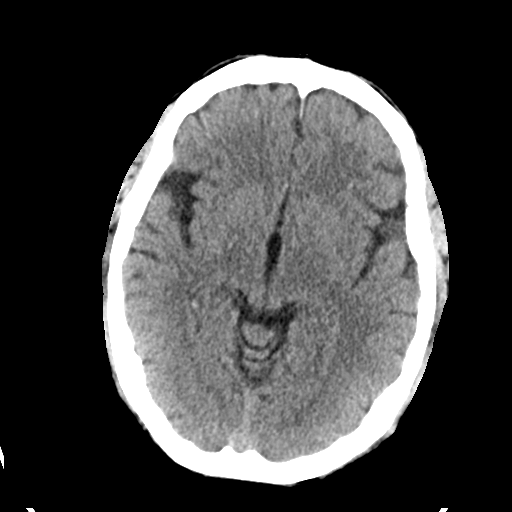
[im 17/33  brain]
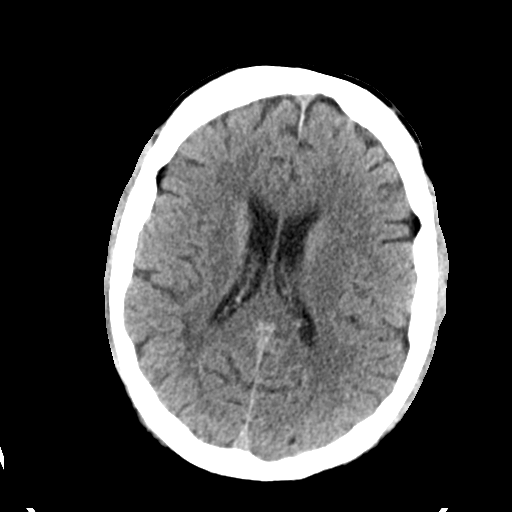
[im 17/33  bone]
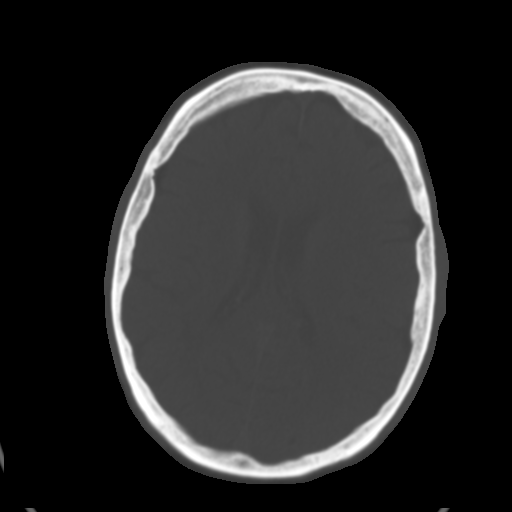
[im 20/33  brain]
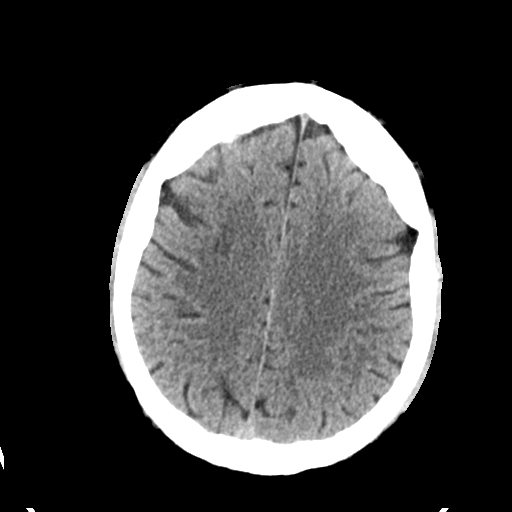
[im 24/33  brain]
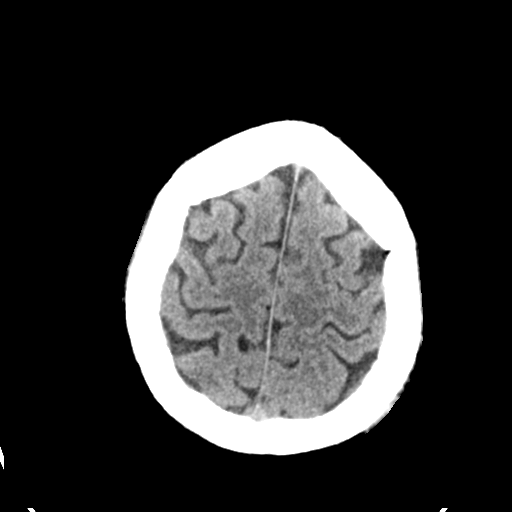
[im 27/33  brain]
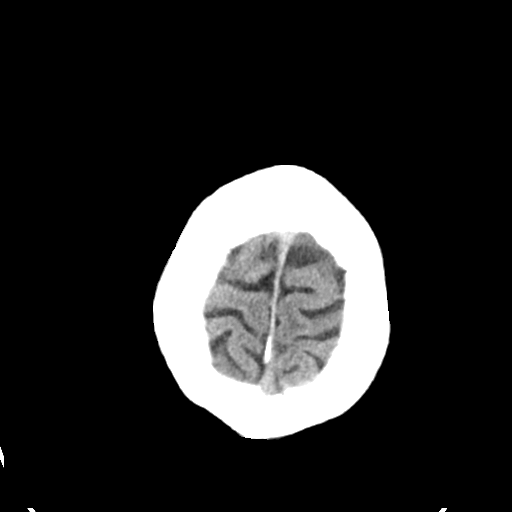
[im 30/33  brain]
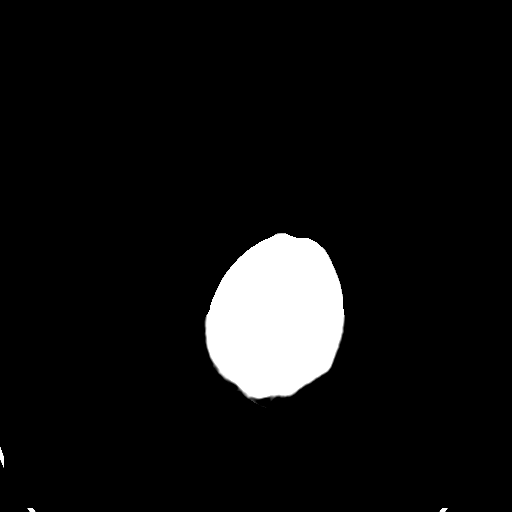
[im 30/33  bone]
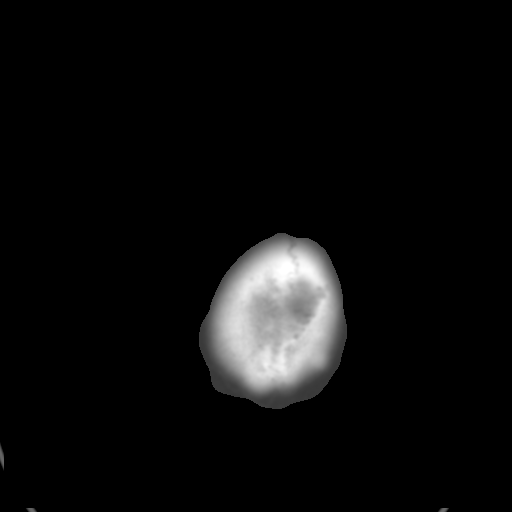

[Series 4: coronal soft tissue · coronal · 0.40mm/px · 3 of 76 slices shown]
[im 26/76  brain]
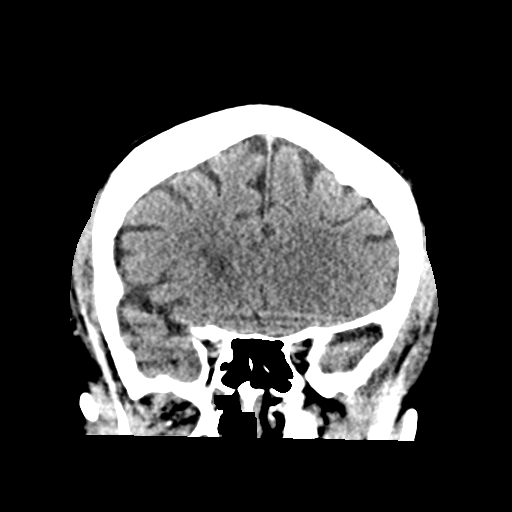
[im 34/76  brain]
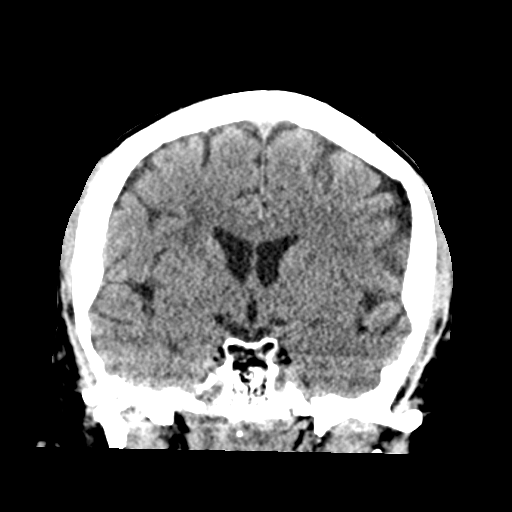
[im 42/76  brain]
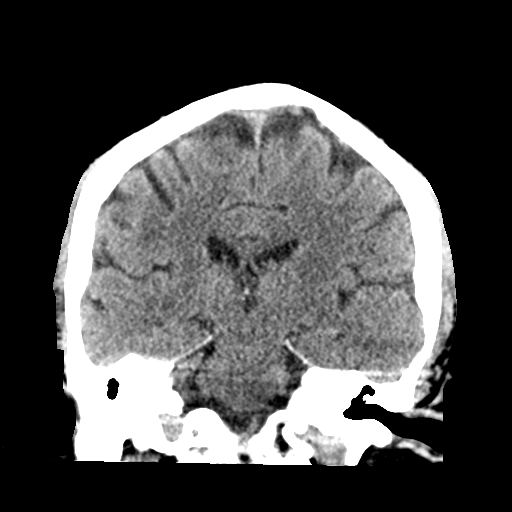

[Series 5: sagittal soft tissue · sagittal · 0.33mm/px · 3 of 67 slices shown]
[im 23/67  brain]
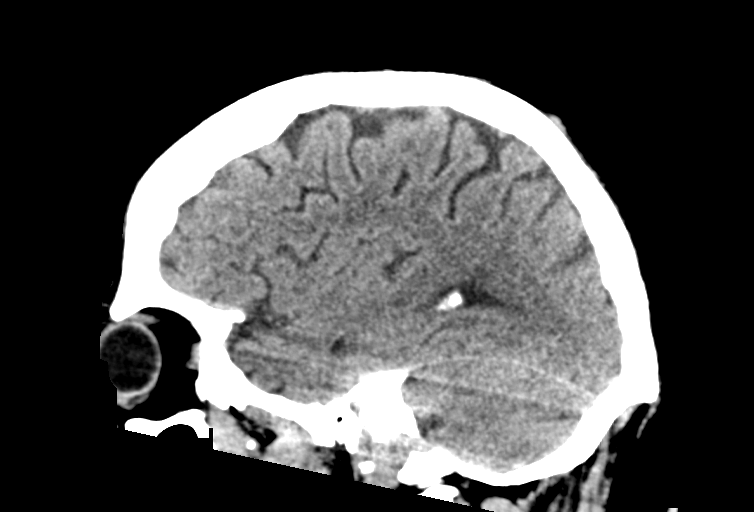
[im 34/67  brain]
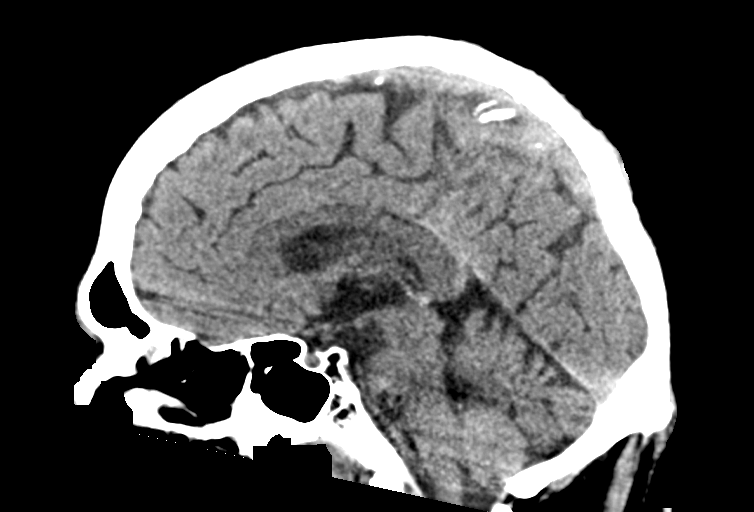
[im 45/67  brain]
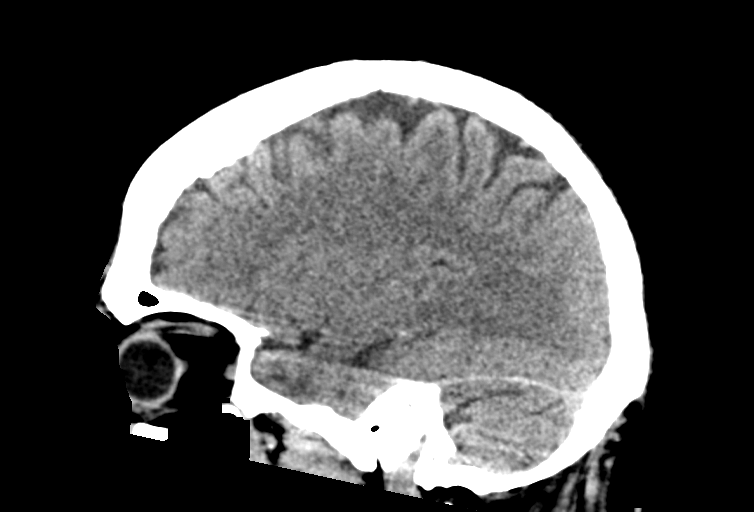

[15 of 47 positions shown; findings below may reference images not displayed]

FINDINGS: Brain: Chronic microvascular disease within the deep white matter.
No acute intracranial abnormality. Specifically, no hemorrhage,
hydrocephalus, mass lesion, acute infarction, or significant
intracranial injury.

Vascular: No hyperdense vessel or unexpected calcification.

Skull: No acute calvarial abnormality.

Sinuses/Orbits: Visualized paranasal sinuses and mastoids clear.
Orbital soft tissues unremarkable.

Other: None
IMPRESSION: Mild chronic small vessel disease throughout the deep white matter.
No acute intracranial abnormality.
